# Patient Record
Sex: Male | Born: 1970 | Race: White | Hispanic: No | Marital: Married | Smoking: Former smoker
Health system: Southern US, Community
[De-identification: ages and names within clinical notes are randomized; demographics above are authoritative.]

## PROBLEM LIST (undated history)

## (undated) ENCOUNTER — Ambulatory Visit (HOSPITAL_COMMUNITY)

## (undated) DIAGNOSIS — C801 Malignant (primary) neoplasm, unspecified: Secondary | ICD-10-CM

## (undated) DIAGNOSIS — Z87442 Personal history of urinary calculi: Secondary | ICD-10-CM

## (undated) DIAGNOSIS — N200 Calculus of kidney: Secondary | ICD-10-CM

## (undated) DIAGNOSIS — G4730 Sleep apnea, unspecified: Secondary | ICD-10-CM

---

## 2001-11-13 DIAGNOSIS — C801 Malignant (primary) neoplasm, unspecified: Secondary | ICD-10-CM

## 2001-11-13 HISTORY — PX: SURGERY SCROTAL / TESTICULAR: SUR1316

## 2001-11-13 HISTORY — DX: Malignant (primary) neoplasm, unspecified: C80.1

## 2002-01-31 ENCOUNTER — Encounter: Payer: Self-pay | Admitting: Neurology

## 2002-01-31 ENCOUNTER — Ambulatory Visit (HOSPITAL_COMMUNITY): Admission: RE | Admit: 2002-01-31 | Discharge: 2002-01-31 | Payer: Self-pay | Admitting: Neurology

## 2002-02-01 ENCOUNTER — Emergency Department (HOSPITAL_COMMUNITY): Admit: 2002-02-01 | Discharge: 2002-02-01 | Payer: Self-pay

## 2003-01-15 ENCOUNTER — Ambulatory Visit (HOSPITAL_COMMUNITY): Admission: RE | Admit: 2003-01-15 | Discharge: 2003-01-15 | Payer: Self-pay | Admitting: Family Medicine

## 2003-01-15 ENCOUNTER — Encounter: Payer: Self-pay | Admitting: Family Medicine

## 2003-01-19 ENCOUNTER — Encounter (INDEPENDENT_AMBULATORY_CARE_PROVIDER_SITE_OTHER): Payer: Self-pay

## 2003-01-19 ENCOUNTER — Ambulatory Visit (HOSPITAL_BASED_OUTPATIENT_CLINIC_OR_DEPARTMENT_OTHER): Admission: RE | Admit: 2003-01-19 | Discharge: 2003-01-19 | Payer: Self-pay | Admitting: Urology

## 2003-01-20 ENCOUNTER — Encounter: Admission: RE | Admit: 2003-01-20 | Discharge: 2003-01-20 | Payer: Self-pay | Admitting: Urology

## 2003-01-20 ENCOUNTER — Encounter: Payer: Self-pay | Admitting: Urology

## 2003-01-22 ENCOUNTER — Ambulatory Visit: Admission: RE | Admit: 2003-01-22 | Discharge: 2003-03-09 | Payer: Self-pay | Admitting: Radiation Oncology

## 2003-06-10 ENCOUNTER — Encounter: Admission: RE | Admit: 2003-06-10 | Discharge: 2003-06-10 | Payer: Self-pay | Admitting: Urology

## 2003-06-10 ENCOUNTER — Encounter: Payer: Self-pay | Admitting: Urology

## 2003-09-04 ENCOUNTER — Encounter: Payer: Self-pay | Admitting: Urology

## 2003-09-04 ENCOUNTER — Encounter: Admission: RE | Admit: 2003-09-04 | Discharge: 2003-09-04 | Payer: Self-pay | Admitting: Urology

## 2003-10-01 ENCOUNTER — Ambulatory Visit: Admission: RE | Admit: 2003-10-01 | Discharge: 2003-10-01 | Payer: Self-pay | Admitting: Radiation Oncology

## 2003-11-25 ENCOUNTER — Ambulatory Visit (HOSPITAL_COMMUNITY): Admission: RE | Admit: 2003-11-25 | Discharge: 2003-11-25 | Payer: Self-pay | Admitting: Urology

## 2004-03-11 ENCOUNTER — Ambulatory Visit (HOSPITAL_COMMUNITY): Admission: RE | Admit: 2004-03-11 | Discharge: 2004-03-11 | Payer: Self-pay | Admitting: Urology

## 2004-03-31 ENCOUNTER — Ambulatory Visit: Admission: RE | Admit: 2004-03-31 | Discharge: 2004-03-31 | Payer: Self-pay | Admitting: Radiation Oncology

## 2004-04-22 ENCOUNTER — Emergency Department (HOSPITAL_COMMUNITY): Admission: EM | Admit: 2004-04-22 | Discharge: 2004-04-23 | Payer: Self-pay | Admitting: Emergency Medicine

## 2004-04-28 ENCOUNTER — Ambulatory Visit (HOSPITAL_BASED_OUTPATIENT_CLINIC_OR_DEPARTMENT_OTHER): Admission: RE | Admit: 2004-04-28 | Discharge: 2004-04-28 | Payer: Self-pay | Admitting: Urology

## 2004-11-04 ENCOUNTER — Ambulatory Visit (HOSPITAL_COMMUNITY): Admission: RE | Admit: 2004-11-04 | Discharge: 2004-11-04 | Payer: Self-pay | Admitting: Urology

## 2005-03-10 ENCOUNTER — Ambulatory Visit (HOSPITAL_COMMUNITY): Admission: RE | Admit: 2005-03-10 | Discharge: 2005-03-10 | Payer: Self-pay | Admitting: Urology

## 2005-08-12 ENCOUNTER — Emergency Department (HOSPITAL_COMMUNITY): Admission: EM | Admit: 2005-08-12 | Discharge: 2005-08-12 | Payer: Self-pay | Admitting: Family Medicine

## 2005-09-08 ENCOUNTER — Ambulatory Visit (HOSPITAL_COMMUNITY): Admission: RE | Admit: 2005-09-08 | Discharge: 2005-09-08 | Payer: Self-pay | Admitting: Urology

## 2006-03-16 ENCOUNTER — Ambulatory Visit (HOSPITAL_COMMUNITY): Admission: RE | Admit: 2006-03-16 | Discharge: 2006-03-16 | Payer: Self-pay | Admitting: Urology

## 2006-06-15 ENCOUNTER — Emergency Department (HOSPITAL_COMMUNITY): Admission: EM | Admit: 2006-06-15 | Discharge: 2006-06-15 | Payer: Self-pay | Admitting: Emergency Medicine

## 2006-08-06 ENCOUNTER — Ambulatory Visit: Payer: Self-pay | Admitting: Orthopedic Surgery

## 2007-03-15 ENCOUNTER — Ambulatory Visit (HOSPITAL_COMMUNITY): Admission: RE | Admit: 2007-03-15 | Discharge: 2007-03-15 | Payer: Self-pay | Admitting: Urology

## 2007-09-21 ENCOUNTER — Emergency Department (HOSPITAL_COMMUNITY): Admission: EM | Admit: 2007-09-21 | Discharge: 2007-09-21 | Payer: Self-pay | Admitting: Emergency Medicine

## 2007-09-21 ENCOUNTER — Encounter: Payer: Self-pay | Admitting: Orthopedic Surgery

## 2007-09-23 ENCOUNTER — Encounter (INDEPENDENT_AMBULATORY_CARE_PROVIDER_SITE_OTHER): Payer: Self-pay | Admitting: *Deleted

## 2007-09-23 ENCOUNTER — Ambulatory Visit: Payer: Self-pay | Admitting: Orthopedic Surgery

## 2007-09-23 DIAGNOSIS — M533 Sacrococcygeal disorders, not elsewhere classified: Secondary | ICD-10-CM | POA: Insufficient documentation

## 2007-09-23 DIAGNOSIS — S93409A Sprain of unspecified ligament of unspecified ankle, initial encounter: Secondary | ICD-10-CM | POA: Insufficient documentation

## 2007-09-25 ENCOUNTER — Encounter: Payer: Self-pay | Admitting: Orthopedic Surgery

## 2007-09-26 ENCOUNTER — Encounter: Payer: Self-pay | Admitting: Orthopedic Surgery

## 2007-10-07 ENCOUNTER — Ambulatory Visit: Payer: Self-pay | Admitting: Orthopedic Surgery

## 2007-11-22 ENCOUNTER — Encounter: Payer: Self-pay | Admitting: Orthopedic Surgery

## 2008-02-20 ENCOUNTER — Ambulatory Visit (HOSPITAL_COMMUNITY): Admission: RE | Admit: 2008-02-20 | Discharge: 2008-02-20 | Payer: Self-pay | Admitting: Urology

## 2009-07-11 ENCOUNTER — Emergency Department (HOSPITAL_COMMUNITY): Admission: EM | Admit: 2009-07-11 | Discharge: 2009-07-11 | Payer: Self-pay | Admitting: Emergency Medicine

## 2009-07-27 ENCOUNTER — Emergency Department (HOSPITAL_COMMUNITY): Admission: EM | Admit: 2009-07-27 | Discharge: 2009-07-27 | Payer: Self-pay | Admitting: Emergency Medicine

## 2009-12-22 ENCOUNTER — Ambulatory Visit (HOSPITAL_COMMUNITY): Admission: RE | Admit: 2009-12-22 | Discharge: 2009-12-22 | Payer: Self-pay | Admitting: Urology

## 2010-03-18 ENCOUNTER — Emergency Department (HOSPITAL_COMMUNITY): Admission: EM | Admit: 2010-03-18 | Discharge: 2010-03-18 | Payer: Self-pay | Admitting: Emergency Medicine

## 2010-06-05 ENCOUNTER — Emergency Department (HOSPITAL_COMMUNITY): Admission: EM | Admit: 2010-06-05 | Discharge: 2010-06-05 | Payer: Self-pay | Admitting: Emergency Medicine

## 2010-08-15 ENCOUNTER — Ambulatory Visit: Payer: Self-pay | Admitting: Cardiology

## 2010-08-15 ENCOUNTER — Observation Stay (HOSPITAL_COMMUNITY): Admission: EM | Admit: 2010-08-15 | Discharge: 2010-08-16 | Payer: Self-pay | Admitting: Emergency Medicine

## 2010-08-16 ENCOUNTER — Encounter (INDEPENDENT_AMBULATORY_CARE_PROVIDER_SITE_OTHER): Payer: Self-pay | Admitting: Emergency Medicine

## 2011-01-25 LAB — POCT CARDIAC MARKERS
CKMB, poc: <1 ng/mL — ABNORMAL LOW (ref 1.0–8.0)
CKMB, poc: <1 ng/mL — ABNORMAL LOW (ref 1.0–8.0)
Myoglobin, poc: 52 ng/mL (ref 12–200)
Myoglobin, poc: 52.3 ng/mL (ref 12–200)
Troponin i, poc: <0.05 ng/mL (ref 0.00–0.09)

## 2011-01-25 LAB — BASIC METABOLIC PANEL
Calcium: 9.2 mg/dL (ref 8.4–10.5)
Creatinine, Ser: 0.97 mg/dL (ref 0.4–1.5)
GFR calc Af Amer: >60 mL/min (ref >60)
GFR calc non Af Amer: >60 mL/min (ref >60)
Glucose, Bld: 103 mg/dL — ABNORMAL HIGH (ref 70–99)
Sodium: 140 mEq/L (ref 135–145)

## 2011-01-25 LAB — DIFFERENTIAL
Basophils Absolute: 0 10*3/uL (ref 0.0–0.1)
Basophils Relative: 1 % (ref 0–1)
Eosinophils Absolute: 0.1 10*3/uL (ref 0.0–0.7)
Monocytes Relative: 9 % (ref 3–12)
Neutro Abs: 3.6 10*3/uL (ref 1.7–7.7)
Neutrophils Relative %: 57 % (ref 43–77)

## 2011-01-25 LAB — CBC
Hemoglobin: 16.2 g/dL (ref 13.0–17.0)
MCH: 29.9 pg (ref 26.0–34.0)
MCHC: 34.2 g/dL (ref 30.0–36.0)
Platelets: 220 10*3/uL (ref 150–400)

## 2011-02-17 LAB — POCT CARDIAC MARKERS: Myoglobin, poc: 73.6 ng/mL (ref 12–200)

## 2011-02-17 LAB — POCT I-STAT, CHEM 8
Calcium, Ion: 1.18 mmol/L (ref 1.12–1.32)
Hemoglobin: 16.7 g/dL (ref 13.0–17.0)
Sodium: 141 mEq/L (ref 135–145)
TCO2: 26 mmol/L (ref 0–100)

## 2011-02-17 LAB — D-DIMER, QUANTITATIVE: D-Dimer, Quant: <0.22 ug/mL-FEU (ref 0.00–0.48)

## 2011-03-11 ENCOUNTER — Emergency Department (HOSPITAL_COMMUNITY)
Admission: EM | Admit: 2011-03-11 | Discharge: 2011-03-11 | Disposition: A | Discharge status: Home / Self Care | Payer: Self-pay | Attending: Emergency Medicine | Admitting: Emergency Medicine

## 2011-03-11 DIAGNOSIS — Y929 Unspecified place or not applicable: Secondary | ICD-10-CM | POA: Insufficient documentation

## 2011-03-11 DIAGNOSIS — W57XXXA Bitten or stung by nonvenomous insect and other nonvenomous arthropods, initial encounter: Secondary | ICD-10-CM | POA: Insufficient documentation

## 2011-03-11 DIAGNOSIS — S30860A Insect bite (nonvenomous) of lower back and pelvis, initial encounter: Secondary | ICD-10-CM | POA: Insufficient documentation

## 2011-03-31 NOTE — Consult Note (Signed)
[place]  Patient:    Sean [name], Sean [name] Visit Number: [id number] MRN: [id number]          Service Type: EMS Location: [facility] Attending Physician:  Pearletha Alfred Dictated by:   Melvyn Novas, M.D. Admit Date:  02/01/2002 Discharge Date: 02/01/2002                            Consultation Report  DATE OF BIRTH:  [birth year]  CHIEF COMPLAINT:  "Worst headache of my life."  HISTORY OF PRESENT ILLNESS:  This is a 40 year old Caucasian right-handed gentleman who works as a Naval architect, is married, and has a history of mild tension headaches.  He states that about a month ago, he started to have postcoital severe headaches, not associated with nausea or visual changes but rather a sudden attack of a headache in his posterior skull that would last for up to 10 minutes at a time.  He states that it helped him to lay down or to take Advil but those treatments did not erase the pain.  Earlier today and yesterday, he had additional attacks which worried his wife and him enough to seek care in the emergency room.  He had just last week seen Dr. Anne Hahn at [facility] for the first time who had asked him to get an MRI/MRA which was performed on Friday.  PAST MEDICAL HISTORY:  The patient denies diabetes, hypertension, or hypercholesterolemia.  He has occasional tension headaches.  He has chronic sinusitis.  MEDICATIONS:  At the current time, none.  ALLERGIES:  No allergies known.  SOCIAL HISTORY:  The patient works as a Naval architect and also he has rather irregular hours.  He spends every night at home.  He states that he normally wakes up at 6 in the morning and returns from work around 8 or 9 p.m.  He states that he is a nonsmoker but drinks a lot of caffeinated beverages.  He drinks rarely alcohol.  FAMILY HISTORY:  There is a strong family history of aneurysm bleeds, cerebrovascular hemorrhages, hypertension.  REVIEW OF  SYSTEMS:  The patient states that he has had no weight changes, fevers, recent infections, no chest pain, allergies, hypertension, coronary artery disease, palpitations, constipation, UTIs, bruises, fractures, sprains, anemia, or rash.  NEUROLOGIC REVIEW OF SYSTEMS:  The patient states that besides having severe headache in the form of almost clusters, he has otherwise only occasional tension headaches that are rather mild and originates in his neck/spine.  He denies dizziness, vertigo, dysarthria, syncope, tinnitus, or dysphasia.  He had no weakness, no numbness, no tingling, no gait or balance disorder, no loss of bowel or bladder continence, no loss of consciousness.  PHYSICAL EVALUATION:  VITAL SIGNS:  The patient has a height of 64 inches with a weight of approximately 210 pounds.  Blood pressure 135/85.  He has a regular heart rate at 68.  Visual acuity was not measured.  GENERAL APPEARANCE:  The patient has a supple neck, full range of motion although there is crepitation.  There were no cranial or cervical bruits.  No signs of infection, fever, or localized tenderness or injury.  CHEST:  There are clear lung sounds bilaterally to auscultation with normal lung expansion.  No distress.  No rales or rhonchi.  HEART:  Pulses were everywhere present without murmurs at baseline.  No vein distention.  No carotid artery bruits, no chest tightness.  EXTREMITIES:  Well-formed, no deformities.  Normal flexibility.  Normal muscle tone.  No contractures or wasting.  SKIN:  Normal color.  No rash, no edema.  The patient is slightly flushed in his face.  ABDOMEN:  Soft, nontender, nondistended.  The patient is obese.  NEUROLOGIC EXAMINATION:  Mental status:  Alert and oriented x3, somewhat worried and squirmy during the exam.  Cranial nerves:  Full visual fields to double simultaneous stimuli.  Normal fundi without palpable edema, pallor, or venous nicking.  Extraocular movements full  and conjugate.  Optokinetic reflexes are equally bilaterally.  Facial sensory and corneal reflexes are present and equal, symmetric facial strength and sensitivity.  Tongue protrudes midline.  Prompt evaluation of uvula in midline.  Shoulder shrug bilaterally equal.  MOTOR TESTING:  Normal strength, tone, and mass in all four extremities.  DTRs bilaterally equal at 1+.  No upgoing toe with plantar stimulation.  No clonus. Good fine motor movements with rapid alternating movements.  No pronator drift.  Cerebellar testing of fingernails was intact bilaterally.  Sensory testing is intact to responses of primary cortical modalities such as cold, vibration, pinprick, and touch.  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  The cerebellar tonsils protrude approximately 4 mm through the foramen magnum.  I, therefore, believe that Sean [name] has a baseline tension headache that has a neuromuscular component and responds to trigger point massage as well as an Arnold-Chiari-related headache that might worsen with long periods of sitting or standing position, rise in blood pressure, or increased intracerebral pressure.  PLAN: 1. I gave Sean [name], for severe headache attack, 6 tablets of Vicodin that he    can take home from the ER.  I also gave him one of the tablets here which    seemed to eradicate the headache component completely. 2. I believe that there is a stress-related origin of his tension headaches    that he therefore needs to address lifestyle issues and work at    [employer] issues with his family.  His wife was present during the    examination and agreed. 3. The Arnold-Chiari syndrome as a grade 1 can cause occasional headaches and    normally occurs during adolescence or early adulthood in a patients 20s or    30s.  Since there is no focal deficit and no  brain stem symptomatology in    the neurologic exam, this patient is certainly not impaired enough to     warrant any surgical evaluation.  I advised him to try to resume recumbent    position when acute headache attacks start and that he could resume his    activities within 20-30 minutes.  The patient will follow up with Dr. Lesia Sago at [facility] to discuss the MRI/MRA findings    and further treatment modalities for his headache. Dictated by:   Melvyn Novas, M.D. Attending Physician:  Pearletha Alfred DD:  02/01/02 TD:  02/03/02 Job: [id number]

## 2011-03-31 NOTE — Op Note (Signed)
Sean [name], Sean [name]                        ACCOUNT NO.:  [id number]   MEDICAL RECORD NO.:  [id number]                   PATIENT TYPE:  AMB   LOCATION:  [facility]                                 FACILITY:  [facility]   PHYSICIAN:  Bertram Millard. Dahlstedt, M.D.          DATE OF BIRTH:  12/25/1970   DATE OF PROCEDURE:  01/19/2003  DATE OF DISCHARGE:                                 OPERATIVE REPORT   PREOPERATIVE DIAGNOSIS:  Left testicular mass.   POSTOPERATIVE DIAGNOSIS:  Left testicular mass.   PROCEDURE:  Left radical orchiectomy.   SURGEON:  Bertram Millard. Retta Diones, M.D.   ASSISTANT:  Crecencio Mc, M.D.   ANESTHESIA:  General.   COMPLICATIONS:  None.   INDICATIONS:  The patient is a 40 year old white male who was recently  evaluated for a left testicular mass.  A testicular ultrasound was obtained,  which demonstrated multiple hypoechoic lesions within the left testis  consistent with possible malignancy.  The patient had serum tumor markers  drawn.  This revealed a normal LDH and AST level.  However, the patient's  beta HCG level was elevated at 119.  After discussing the above findings  with the patient, he decided to proceed with a left radical orchiectomy.  The potential risks and benefits of this procedure were explained to the  patient, and he consented.   DESCRIPTION OF PROCEDURE:  The patient was taken to the operating room and a  general anesthetic was administered.  The patient was given preoperative  antibiotics, placed in a supine position, and his left groin was prepped and  draped in the usual sterile fashion.  Next a skin incision was made with the  #15 blade over the left external inguinal ring.  The incision measured  approximately 4-5 cm.  The subcutaneous tissues were then taken down with  electrocautery.  The shelving edge of the external oblique was delineated  with utilization of Metzenbaum scissors.  The external ring was then  identified and the cord was able  to be delivered with aid of a DeBakey  forceps.  A Penrose drain was then doubly passed around the inguinal canal.  The testis was then delivered and the gubernacular attachments were taken  down with electrocautery.  The external oblique was then opened at the level  of the superficial ring.  The cremasteric attachments to the cord were  dissected free, allowing the spermatic cord to be mobilized proximally.  Three Kelly clamps were then placed around the spermatic cord and the  Penrose drain was removed.  The specimen was divided between the middle and  most distal clamps and passed off the table for permanent pathologic  specimen.  A 2-0 silk tie was then placed around the proximal clamp, and the  cord was ligated.  A 0 silk suture ligature was then placed around the  distal clamp and the cord was ligated here as well.  The cord was then  allowed to retract proximally toward the retroperitoneum.  The wound was  then inspected and hemostasis appeared adequate.  Of note, a spermatic cord  block had been administered with approximately 7 mL of 0.5% Marcaine prior  to dividing the cord.  The external oblique was then brought together with a  running 2-0 silk suture.  Scarpa's fascia was reapproximated with a running  2-0 Vicryl suture.  Approximately three more cubic centimeters of 0.5%  Marcaine were used for local anesthesia of the superficial wound.  The skin  was then reapproximated with a running 4-0 Vicryl subcuticular suture.  Steri-Strips and a sterile dressing were placed.  The  patient appeared to tolerate the procedure well and without any  complications.  He was able to be transferred to the recovery unit in  satisfactory condition.  Please note that Bertram Millard. Dahlstedt, M.D., was  the operating surgeon and was present and participated in the entire  procedure.     Crecencio Mc, M.D.                          Bertram Millard. Retta Diones, M.D.    Arlana Hove  D:  01/19/2003  T:   01/19/2003  Job:  [id number]

## 2011-03-31 NOTE — Op Note (Signed)
Sean [name], Sean [name]                        ACCOUNT NO.:  [id number]   MEDICAL RECORD NO.:  [id number]                   PATIENT TYPE:  AMB   LOCATION:  [facility]                                 FACILITY:  [facility]   PHYSICIAN:  Bertram Millard. Dahlstedt, M.D.          DATE OF BIRTH:  September 23, 1971   DATE OF PROCEDURE:  04/28/2004  DATE OF DISCHARGE:                                 OPERATIVE REPORT   PREOPERATIVE DIAGNOSIS:  Right ureteral stone.   POSTOPERATIVE DIAGNOSIS:  Right ureteral stone, passed.   PRINCIPAL PROCEDURES:  1. Cystoscopy.  2. Bilateral retrograde ureteral pyelograms.  3. Right ureteroscopy.   SURGEON:  Bertram Millard. Dahlstedt, M.D.   ANESTHESIA:  General with LMA.   COMPLICATIONS:  None.   BRIEF HISTORY:  This 40 year old male has been seeing me for approximately 5  weeks with a symptomatic right ureteral stone.  His last symptom was  yesterday morning.  He first presented with an upper ureteral stone.  It was  measured 3-4 mm in size.  He had significant pain over the weekend and had a  CT scan.  This showed a lower ureteral stone on the right.  He has also had  some dysuria recently.   I saw the patient in the office yesterday.  KUB failed to show any  significant calcifications, but he has a very thick abdomen.  Prior KUB  failed to show a significant stone as well.  At that time, CT urogram did  show the upper ureteral stone.   The patient has requested treatment for the stone.  As the stone was not  visible on a KUB, I suggested that we proceed with cytoscopy and  ureteroscopy and possible holmium laser.  He presents at this time for that  procedure.   DESCRIPTION OF PROCEDURE:  The patient was administered preoperative IV  antibiotics.  He was taken to the operating room where a general anesthetic  was administered using the LMA.  He was placed in the dorsal lithotomy  position.  Genitalia and perineum were prepped and draped.  A 22 French  panendoscope was  advanced into his bladder.  The bladder appeared  unremarkable.  The right ureter was cannulated with a 6 [place] open end  catheter.  Retrograde failed to show any filling defect in the distal, mid  or proximal ureter.  There was no evident hydronephrosis.  A left retrograde  was performed at that time as well.  Due to the fact that this patient had  stone symptoms as recently as yesterday, I then decided to perform  ureteroscopy.  A 6 French ureteroscope was advanced through the urethra and  into the bladder.  Using a guide wire as a guide, the entire right ureter  was evaluated with the ureteroscope.  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  This was traversed.  The mid and proximal ureter was  normal.  At this  point, the scope was removed.  The bladder was drained and  the procedure terminated.  It was felt that the patient passed the stone as  soon as yesterday.   A B&O suppository was placed and the patient was given 30 mg of Toradol IV.   He will be followed up in approximately one week.   DISCHARGE MEDICATIONS INCLUDE:  1. Bactrim DS one p.o. b.i.d. for 5 days.  2. Urelle one p.o. q.6 hours p.r.n. urinary burning.  3. Percocet which he has at home.                                               Bertram Millard. Dahlstedt, M.D.    SMD/MEDQ  D:  04/28/2004  T:  04/28/2004  Job:  [id number]   cc:   Kirk Ruths, M.D.  [address]  [place]  [place] [address]  Fax: [phone]

## 2011-09-14 ENCOUNTER — Inpatient Hospital Stay (INDEPENDENT_AMBULATORY_CARE_PROVIDER_SITE_OTHER)
Admission: RE | Admit: 2011-09-14 | Discharge: 2011-09-14 | Disposition: A | Discharge status: Home / Self Care | Payer: BC Managed Care – PPO | Source: Ambulatory Visit | Attending: Family Medicine | Admitting: Family Medicine

## 2011-09-14 DIAGNOSIS — J029 Acute pharyngitis, unspecified: Secondary | ICD-10-CM

## 2012-11-12 ENCOUNTER — Encounter (HOSPITAL_COMMUNITY): Payer: Self-pay | Admitting: *Deleted

## 2012-11-12 ENCOUNTER — Emergency Department (HOSPITAL_COMMUNITY)
Admission: EM | Admit: 2012-11-12 | Discharge: 2012-11-12 | Disposition: A | Discharge status: Home / Self Care | Payer: BC Managed Care – PPO | Attending: Emergency Medicine | Admitting: Emergency Medicine

## 2012-11-12 DIAGNOSIS — R05 Cough: Secondary | ICD-10-CM | POA: Insufficient documentation

## 2012-11-12 DIAGNOSIS — IMO0001 Reserved for inherently not codable concepts without codable children: Secondary | ICD-10-CM | POA: Insufficient documentation

## 2012-11-12 DIAGNOSIS — K5289 Other specified noninfective gastroenteritis and colitis: Secondary | ICD-10-CM | POA: Insufficient documentation

## 2012-11-12 DIAGNOSIS — Z87891 Personal history of nicotine dependence: Secondary | ICD-10-CM | POA: Insufficient documentation

## 2012-11-12 DIAGNOSIS — R6883 Chills (without fever): Secondary | ICD-10-CM | POA: Insufficient documentation

## 2012-11-12 DIAGNOSIS — R51 Headache: Secondary | ICD-10-CM | POA: Insufficient documentation

## 2012-11-12 DIAGNOSIS — R059 Cough, unspecified: Secondary | ICD-10-CM | POA: Insufficient documentation

## 2012-11-12 DIAGNOSIS — R52 Pain, unspecified: Secondary | ICD-10-CM | POA: Insufficient documentation

## 2012-11-12 DIAGNOSIS — R630 Anorexia: Secondary | ICD-10-CM | POA: Insufficient documentation

## 2012-11-12 DIAGNOSIS — R197 Diarrhea, unspecified: Secondary | ICD-10-CM | POA: Insufficient documentation

## 2012-11-12 DIAGNOSIS — J3489 Other specified disorders of nose and nasal sinuses: Secondary | ICD-10-CM | POA: Insufficient documentation

## 2012-11-12 DIAGNOSIS — Z8547 Personal history of malignant neoplasm of testis: Secondary | ICD-10-CM | POA: Insufficient documentation

## 2012-11-12 DIAGNOSIS — R5381 Other malaise: Secondary | ICD-10-CM | POA: Insufficient documentation

## 2012-11-12 DIAGNOSIS — K529 Noninfective gastroenteritis and colitis, unspecified: Secondary | ICD-10-CM

## 2012-11-12 DIAGNOSIS — R5383 Other fatigue: Secondary | ICD-10-CM | POA: Insufficient documentation

## 2012-11-12 HISTORY — DX: Malignant (primary) neoplasm, unspecified: C80.1

## 2012-11-12 MED ORDER — HYDROCODONE-ACETAMINOPHEN 5-325 MG PO TABS
2.0000 | ORAL_TABLET | Freq: Once | ORAL | Status: AC
  Administered 2012-11-12: 2 via ORAL
  Filled 2012-11-12: qty 2

## 2012-11-12 MED ORDER — PROMETHAZINE HCL 12.5 MG PO TABS
25.0000 mg | ORAL_TABLET | Freq: Once | ORAL | Status: AC
  Administered 2012-11-12: 25 mg via ORAL
  Filled 2012-11-12: qty 2

## 2012-11-12 MED ORDER — HYDROCODONE-ACETAMINOPHEN 7.5-325 MG PO TABS: 1.0000 | ORAL_TABLET | ORAL | Status: AC | PRN

## 2012-11-12 MED ORDER — PROMETHAZINE HCL 12.5 MG PO TABS: 12.5000 mg | ORAL_TABLET | Freq: Four times a day (QID) | ORAL | Status: DC | PRN

## 2012-11-12 MED ORDER — PSEUDOEPHEDRINE HCL 60 MG PO TABS
60.0000 mg | ORAL_TABLET | Freq: Once | ORAL | Status: AC
  Administered 2012-11-12: 60 mg via ORAL
  Filled 2012-11-12: qty 1

## 2012-11-12 MED ORDER — PSEUDOEPHEDRINE HCL 60 MG PO TABS: ORAL_TABLET | ORAL | Status: DC

## 2012-11-12 NOTE — ED Notes (Signed)
Pt states N/V/D x2 days, body aches, chills.

## 2012-11-12 NOTE — ED Provider Notes (Signed)
History     CSN: [id number]  Arrival date & time 11/12/12  4098   First MD Initiated Contact with Patient 11/12/12 1920      Chief Complaint  Patient presents with  . Nausea  . Emesis  . Diarrhea  . Generalized Body Aches    (Consider location/radiation/quality/duration/timing/severity/associated sxs/prior treatment) Patient is a 41 y.o. male presenting with vomiting and diarrhea. The history is provided by the patient and the spouse.  Emesis  This is a new problem. The current episode started 2 days ago. The problem occurs 2 to 4 times per day. The problem has not changed since onset.Maximum temperature: Temp not checked, but wife states pt felt warm. Associated symptoms include chills, cough, diarrhea, headaches and myalgias. Pertinent negatives include no abdominal pain and no arthralgias. Associated symptoms comments: Headache and nasal congestion.. Risk factors include ill contacts.  Diarrhea The primary symptoms include fatigue, nausea, vomiting, diarrhea and myalgias. Primary symptoms do not include abdominal pain, melena, hematemesis, jaundice, hematochezia, dysuria, arthralgias or rash. The illness began 2 days ago. The onset was gradual. The problem has not changed since onset. The illness is also significant for chills and anorexia. The illness does not include back pain. Associated medical issues comments: testicular cancer.    Past Medical History  Diagnosis Date  . Cancer 2003    testicular    History reviewed. No pertinent past surgical history.  History reviewed. No pertinent family history.  History  Substance Use Topics  . Smoking status: Former Games developer  . Smokeless tobacco: Not on file  . Alcohol Use: Yes      Review of Systems  Constitutional: Positive for chills and fatigue. Negative for activity change.       All ROS Neg except as noted in HPI  HENT: Negative for nosebleeds and neck pain.   Eyes: Negative for photophobia and discharge.    Respiratory: Positive for cough. Negative for shortness of breath and wheezing.   Cardiovascular: Negative for chest pain and palpitations.  Gastrointestinal: Positive for nausea, vomiting, diarrhea and anorexia. Negative for abdominal pain, blood in stool, melena, hematochezia, hematemesis and jaundice.  Genitourinary: Negative for dysuria, frequency and hematuria.  Musculoskeletal: Positive for myalgias. Negative for back pain and arthralgias.  Skin: Negative.  Negative for rash.  Neurological: Positive for headaches. Negative for dizziness, seizures and speech difficulty.  Psychiatric/Behavioral: Negative for hallucinations and confusion.    Allergies  Hydromorphone hcl  Home Medications  No current outpatient prescriptions on file.  BP 108/60  Pulse 100  Temp 97.7 F (36.5 C) (Oral)  Resp 20  Ht 5\' 9"  (1.753 m)  Wt 237 lb (107.502 kg)  BMI 35.00 kg/m2  SpO2 98%  Physical Exam  Nursing note and vitals reviewed. Constitutional: He is oriented to person, place, and time. He appears well-developed and well-nourished.  Non-toxic appearance.  HENT:  Head: Normocephalic.  Right Ear: Tympanic membrane and external ear normal.  Left Ear: Tympanic membrane and external ear normal.       Nasal congestion.  Eyes: EOM and lids are normal. Pupils are equal, round, and reactive to light.  Neck: Normal range of motion. Neck supple. Carotid bruit is not present.  Cardiovascular: Normal rate, regular rhythm, normal heart sounds, intact distal pulses and normal pulses.   Pulmonary/Chest: Breath sounds normal. No respiratory distress.  Abdominal: Soft. Bowel sounds are normal. He exhibits no distension and no mass. There is no tenderness. There is no rebound and no guarding.  Musculoskeletal: Normal  range of motion. He exhibits no edema and no tenderness.  Lymphadenopathy:       Head (right side): No submandibular adenopathy present.       Head (left side): No submandibular adenopathy  present.    He has no cervical adenopathy.  Neurological: He is alert and oriented to person, place, and time. He has normal strength. No cranial nerve deficit or sensory deficit.  Skin: Skin is warm and dry. He is not diaphoretic. No cyanosis. No pallor.  Psychiatric: He has a normal mood and affect. His speech is normal.    ED Course  Procedures (including critical care time)  Labs Reviewed - No data to display No results found. Pulse oximetry 98% on room air. Within normal limits by my interpretation.  No diagnosis found.    MDM  I have reviewed nursing notes, vital signs, and all appropriate lab and imaging results for this patient. History and examination are consistent with viral illness. The vital signs are noted to be well within normal limits. The patient is treated with Phenergan 12.5 mg every 6 hours, Sudafed 3 times daily for congestion, and Norco every 4 hours if needed for pain and body aches. Patient advised to increase fluids, and wash hands frequently. Patient is to see his primary physician if not improving.       Kathie Dike, [place] 11/13/12 1904

## 2012-11-15 NOTE — ED Provider Notes (Signed)
Medical screening examination/treatment/procedure(s) were performed by non-physician practitioner and as supervising physician I was immediately available for consultation/collaboration.  Donnetta Hutching, MD 11/15/12 [phone]

## 2013-08-20 ENCOUNTER — Other Ambulatory Visit: Payer: Self-pay | Admitting: Urology

## 2013-08-20 ENCOUNTER — Ambulatory Visit (HOSPITAL_COMMUNITY)
Admission: RE | Admit: 2013-08-20 | Discharge: 2013-08-20 | Disposition: A | Discharge status: Home / Self Care | Payer: BC Managed Care – PPO | Source: Ambulatory Visit | Attending: Urology | Admitting: Urology

## 2013-08-20 DIAGNOSIS — C6290 Malignant neoplasm of unspecified testis, unspecified whether descended or undescended: Secondary | ICD-10-CM

## 2014-12-27 ENCOUNTER — Emergency Department (INDEPENDENT_AMBULATORY_CARE_PROVIDER_SITE_OTHER)
Admission: EM | Admit: 2014-12-27 | Discharge: 2014-12-27 | Disposition: A | Discharge status: Home / Self Care | Payer: BLUE CROSS/BLUE SHIELD | Source: Home / Self Care | Attending: Family Medicine | Admitting: Family Medicine

## 2014-12-27 ENCOUNTER — Encounter (HOSPITAL_COMMUNITY): Payer: Self-pay

## 2014-12-27 DIAGNOSIS — J069 Acute upper respiratory infection, unspecified: Secondary | ICD-10-CM

## 2014-12-27 MED ORDER — HYDROCOD POLST-CHLORPHEN POLST 10-8 MG/5ML PO LQCR: 5.0000 mL | Freq: Two times a day (BID) | ORAL | Status: DC | PRN

## 2014-12-27 MED ORDER — IPRATROPIUM BROMIDE 0.06 % NA SOLN: 2.0000 | Freq: Four times a day (QID) | NASAL | Status: DC

## 2014-12-27 NOTE — ED Provider Notes (Signed)
CSN: [id number]     Arrival date & time 12/27/14  5170 History   First MD Initiated Contact with Patient 12/27/14 1003     Chief Complaint  Patient presents with  . URI   (Consider location/radiation/quality/duration/timing/severity/associated sxs/prior Treatment) Patient is a 44 y.o. male presenting with URI. The history is provided by the patient.  URI Presenting symptoms: congestion, cough and rhinorrhea   Presenting symptoms: no fever   Severity:  Mild Onset quality:  Gradual Duration:  2 days Progression:  Unchanged Chronicity:  New Relieved by:  None tried Worsened by:  Nothing tried Associated symptoms: no wheezing   Risk factors: sick contacts   Risk factors comment:  Wife had similar sx first.   Past Medical History  Diagnosis Date  . Cancer 2003    testicular   History reviewed. No pertinent past surgical history. History reviewed. No pertinent family history. History  Substance Use Topics  . Smoking status: Former Research scientist (life sciences)  . Smokeless tobacco: Not on file  . Alcohol Use: Yes    Review of Systems  Constitutional: Negative.  Negative for fever.  HENT: Positive for congestion, postnasal drip and rhinorrhea.   Respiratory: Positive for cough. Negative for shortness of breath and wheezing.   Cardiovascular: Negative.   Gastrointestinal: Negative.     Allergies  Hydromorphone hcl  Home Medications   Prior to Admission medications   Medication Sig Start Date End Date Taking? Authorizing Provider  acetaminophen (TYLENOL) 500 MG tablet Take 500-1,000 mg by mouth every 6 (six) hours as needed. For pain/fever    Historical Provider, MD  aspirin EC 81 MG tablet Take 81 mg by mouth daily.    Historical Provider, MD  promethazine (PHENERGAN) 12.5 MG tablet Take 1 tablet (12.5 mg total) by mouth every 6 (six) hours as needed for nausea. 11/12/12   Lenox Ahr, PA-C  pseudoephedrine (SUDAFED) 60 MG tablet 1 po tid for congestion 11/12/12   Lenox Ahr, PA-C    BP 126/89 mmHg  Pulse 80  Temp(Src) 98 F (36.7 C) (Oral)  SpO2 98% Physical Exam  Constitutional: He is oriented to person, place, and time. He appears well-developed and well-nourished. No distress.  HENT:  Head: Normocephalic.  Right Ear: External ear normal.  Left Ear: External ear normal.  Mouth/Throat: Oropharynx is clear and moist.  Eyes: Pupils are equal, round, and reactive to light.  Neck: Normal range of motion. Neck supple.  Cardiovascular: Regular rhythm, normal heart sounds and intact distal pulses.   Pulmonary/Chest: Effort normal and breath sounds normal.  Lymphadenopathy:    He has no cervical adenopathy.  Neurological: He is alert and oriented to person, place, and time.  Skin: Skin is warm and dry.  Nursing note and vitals reviewed.   ED Course  Procedures (including critical care time) Labs Review Labs Reviewed - No data to display  Imaging Review No results found.   MDM   1. URI (upper respiratory infection)    Pt states that tussionex seems to work real good for me.    Billy Fischer, MD 12/27/14 [phone]

## 2014-12-27 NOTE — Discharge Instructions (Signed)
Drink plenty of fluids as discussed, use medicine as prescribed, and mucinex or delsym for cough. Return or see your doctor if further problems °

## 2014-12-27 NOTE — ED Notes (Signed)
C/o URI type symptoms x 3 days. Has not taken anything for his problems

## 2015-03-24 ENCOUNTER — Encounter (HOSPITAL_COMMUNITY): Payer: Self-pay | Admitting: *Deleted

## 2015-03-24 ENCOUNTER — Observation Stay (HOSPITAL_COMMUNITY)
Admission: EM | Admit: 2015-03-24 | Discharge: 2015-03-26 | Disposition: A | Discharge status: Home / Self Care | Payer: BLUE CROSS/BLUE SHIELD | Attending: Urology | Admitting: Urology

## 2015-03-24 ENCOUNTER — Emergency Department (HOSPITAL_COMMUNITY): Payer: BLUE CROSS/BLUE SHIELD

## 2015-03-24 DIAGNOSIS — Z8547 Personal history of malignant neoplasm of testis: Secondary | ICD-10-CM | POA: Diagnosis not present

## 2015-03-24 DIAGNOSIS — Z6838 Body mass index (BMI) 38.0-38.9, adult: Secondary | ICD-10-CM | POA: Insufficient documentation

## 2015-03-24 DIAGNOSIS — N23 Unspecified renal colic: Secondary | ICD-10-CM

## 2015-03-24 DIAGNOSIS — Z791 Long term (current) use of non-steroidal anti-inflammatories (NSAID): Secondary | ICD-10-CM | POA: Insufficient documentation

## 2015-03-24 DIAGNOSIS — Z7982 Long term (current) use of aspirin: Secondary | ICD-10-CM | POA: Insufficient documentation

## 2015-03-24 DIAGNOSIS — Z79899 Other long term (current) drug therapy: Secondary | ICD-10-CM | POA: Diagnosis not present

## 2015-03-24 DIAGNOSIS — R109 Unspecified abdominal pain: Secondary | ICD-10-CM | POA: Diagnosis present

## 2015-03-24 DIAGNOSIS — N200 Calculus of kidney: Principal | ICD-10-CM | POA: Insufficient documentation

## 2015-03-24 DIAGNOSIS — Z87891 Personal history of nicotine dependence: Secondary | ICD-10-CM | POA: Diagnosis not present

## 2015-03-24 DIAGNOSIS — Z419 Encounter for procedure for purposes other than remedying health state, unspecified: Secondary | ICD-10-CM

## 2015-03-24 LAB — COMPREHENSIVE METABOLIC PANEL
ALBUMIN: 4.3 g/dL (ref 3.5–5.0)
ALT: 55 U/L (ref 17–63)
ANION GAP: 12 (ref 5–15)
AST: 30 U/L (ref 15–41)
Alkaline Phosphatase: 61 U/L (ref 38–126)
BILIRUBIN TOTAL: 0.8 mg/dL (ref 0.3–1.2)
BUN: 13 mg/dL (ref 6–20)
CHLORIDE: 104 mmol/L (ref 101–111)
CO2: 25 mmol/L (ref 22–32)
CREATININE: 1.15 mg/dL (ref 0.61–1.24)
Calcium: 9.6 mg/dL (ref 8.9–10.3)
GFR calc Af Amer: >60 mL/min (ref >60)
GFR calc non Af Amer: >60 mL/min (ref >60)
Glucose, Bld: 133 mg/dL — ABNORMAL HIGH (ref 70–99)
Potassium: 4.2 mmol/L (ref 3.5–5.1)
Sodium: 141 mmol/L (ref 135–145)
TOTAL PROTEIN: 7.4 g/dL (ref 6.5–8.1)

## 2015-03-24 LAB — URINALYSIS, ROUTINE W REFLEX MICROSCOPIC
BILIRUBIN URINE: NEGATIVE
GLUCOSE, UA: NEGATIVE mg/dL
Ketones, ur: NEGATIVE mg/dL
Nitrite: NEGATIVE
PH: 6.5 (ref 5.0–8.0)
Protein, ur: 100 mg/dL — AB
SPECIFIC GRAVITY, URINE: 1.024 (ref 1.005–1.030)
Urobilinogen, UA: 0.2 mg/dL (ref 0.0–1.0)

## 2015-03-24 LAB — URINE MICROSCOPIC-ADD ON

## 2015-03-24 LAB — CBC WITH DIFFERENTIAL/PLATELET
BASOS ABS: 0.1 10*3/uL (ref 0.0–0.1)
BASOS PCT: 1 % (ref 0–1)
EOS ABS: 0.2 10*3/uL (ref 0.0–0.7)
EOS PCT: 2 % (ref 0–5)
HCT: 47.2 % (ref 39.0–52.0)
Hemoglobin: 15.7 g/dL (ref 13.0–17.0)
Lymphocytes Relative: 35 % (ref 12–46)
Lymphs Abs: 2.8 10*3/uL (ref 0.7–4.0)
MCH: 29.3 pg (ref 26.0–34.0)
MCHC: 33.3 g/dL (ref 30.0–36.0)
MCV: 88.2 fL (ref 78.0–100.0)
Monocytes Absolute: 0.9 10*3/uL (ref 0.1–1.0)
Monocytes Relative: 11 % (ref 3–12)
NEUTROS ABS: 3.9 10*3/uL (ref 1.7–7.7)
NEUTROS PCT: 51 % (ref 43–77)
PLATELETS: 245 10*3/uL (ref 150–400)
RBC: 5.35 MIL/uL (ref 4.22–5.81)
RDW: 13.2 % (ref 11.5–15.5)
WBC: 7.8 10*3/uL (ref 4.0–10.5)

## 2015-03-24 MED ORDER — FENTANYL CITRATE (PF) 100 MCG/2ML IJ SOLN
INTRAMUSCULAR | Status: AC
  Filled 2015-03-24: qty 2

## 2015-03-24 MED ORDER — MORPHINE SULFATE 4 MG/ML IJ SOLN: 8.0000 mg | INTRAMUSCULAR | Status: DC | PRN

## 2015-03-24 MED ORDER — FENTANYL CITRATE (PF) 100 MCG/2ML IJ SOLN
75.0000 ug | Freq: Once | INTRAMUSCULAR | Status: AC
  Administered 2015-03-24: 75 ug via INTRAVENOUS

## 2015-03-24 MED ORDER — SODIUM CHLORIDE 0.9 % IV BOLUS (SEPSIS)
1000.0000 mL | Freq: Once | INTRAVENOUS | Status: AC
  Administered 2015-03-24: 1000 mL via INTRAVENOUS

## 2015-03-24 MED ORDER — MORPHINE SULFATE 4 MG/ML IJ SOLN
8.0000 mg | Freq: Once | INTRAMUSCULAR | Status: AC
  Administered 2015-03-24: 8 mg via INTRAVENOUS
  Filled 2015-03-24: qty 2

## 2015-03-24 MED ORDER — ONDANSETRON HCL 4 MG/2ML IJ SOLN
INTRAMUSCULAR | Status: AC
  Filled 2015-03-24: qty 2

## 2015-03-24 MED ORDER — MORPHINE SULFATE 4 MG/ML IJ SOLN: 4.0000 mg | Freq: Once | INTRAMUSCULAR | Status: DC

## 2015-03-24 MED ORDER — MORPHINE SULFATE 4 MG/ML IJ SOLN
6.0000 mg | Freq: Once | INTRAMUSCULAR | Status: AC
  Administered 2015-03-24: 6 mg via INTRAVENOUS
  Filled 2015-03-24: qty 2

## 2015-03-24 MED ORDER — ONDANSETRON HCL 4 MG/2ML IJ SOLN
4.0000 mg | Freq: Once | INTRAMUSCULAR | Status: AC
  Administered 2015-03-24: 4 mg via INTRAVENOUS

## 2015-03-24 MED ORDER — KETOROLAC TROMETHAMINE 30 MG/ML IJ SOLN
30.0000 mg | Freq: Once | INTRAMUSCULAR | Status: AC
  Administered 2015-03-24: 30 mg via INTRAVENOUS
  Filled 2015-03-24: qty 1

## 2015-03-24 MED ORDER — ONDANSETRON HCL 4 MG/2ML IJ SOLN
4.0000 mg | Freq: Once | INTRAMUSCULAR | Status: AC
  Administered 2015-03-24: 4 mg via INTRAVENOUS
  Filled 2015-03-24: qty 2

## 2015-03-24 NOTE — ED Notes (Signed)
Pt complains of severe right sided abdominal pain for the past hour. Pt states he was told he has a large kidney stone, was told by [facility] to come to ED.

## 2015-03-24 NOTE — ED Provider Notes (Signed)
  Physical Exam  BP 113/69 mmHg  Pulse 72  SpO2 100%  Physical Exam  ED Course  Procedures  MDM Assumed care of pt from Dr. Darl Householder.  Briefly, pt is presenting with renal colic, known stone.  Urology evaluating pt at time of assumption of care with tentative plan to admit, pain refractory.  Pt admitted by urology.  Renal colic on right side       Debby Freiberg, MD 03/25/15 0600

## 2015-03-24 NOTE — ED Provider Notes (Signed)
CSN: [id number]     Arrival date & time 03/24/15  2124 History   First MD Initiated Contact with Patient 03/24/15 2142     Chief Complaint  Patient presents with  . Abdominal Pain     (Consider location/radiation/quality/duration/timing/severity/associated sxs/prior Treatment) The history is provided by the patient and the spouse.  Sean [name] is a 44 y.o. male history of testicular cancer here presenting with right flank pain. Right flank pain for the last several days and had a CT in the office that showed right 6 mm stone. He was sent home with pain medicine and Flomax. States that the pain improved initially and then came back about 2 hours ago and associated with some hematuria. Denies any dysuria or fever or vomiting but feels nauseated.    Past Medical History  Diagnosis Date  . Cancer 2003    testicular   History reviewed. No pertinent past surgical history. No family history on file. History  Substance Use Topics  . Smoking status: Former Research scientist (life sciences)  . Smokeless tobacco: Not on file  . Alcohol Use: Yes    Review of Systems  Gastrointestinal: Positive for abdominal pain.  Genitourinary: Positive for flank pain.  All other systems reviewed and are negative.     Allergies  Hydromorphone hcl  Home Medications   Prior to Admission medications   Medication Sig Start Date End Date Taking? Authorizing Provider  aspirin EC 81 MG tablet Take 81 mg by mouth daily.   Yes Historical Provider, MD  ibuprofen (ADVIL,MOTRIN) 200 MG tablet Take 800 mg by mouth every 6 (six) hours as needed for moderate pain.   Yes Historical Provider, MD  oxyCODONE-acetaminophen (PERCOCET/ROXICET) 5-325 MG per tablet Take 1 tablet by mouth every 4 (four) hours as needed. pain 03/17/15  Yes Historical Provider, MD  tamsulosin (FLOMAX) 0.4 MG CAPS capsule Take 1 capsule by mouth daily. 03/17/15  Yes Historical Provider, MD  chlorpheniramine-HYDROcodone (TUSSIONEX PENNKINETIC ER) 10-8 MG/5ML LQCR Take  5 mLs by mouth every 12 (twelve) hours as needed for cough. Patient not taking: Reported on 03/24/2015 12/27/14   Billy Fischer, MD  ipratropium (ATROVENT) 0.06 % nasal spray Place 2 sprays into both nostrils 4 (four) times daily. Patient not taking: Reported on 03/24/2015 12/27/14   Billy Fischer, MD  promethazine (PHENERGAN) 12.5 MG tablet Take 1 tablet (12.5 mg total) by mouth every 6 (six) hours as needed for nausea. Patient not taking: Reported on 03/24/2015 11/12/12   Lily Kocher, PA-C  pseudoephedrine (SUDAFED) 60 MG tablet 1 po tid for congestion Patient not taking: Reported on 03/24/2015 11/12/12   Lily Kocher, PA-C   BP 113/69 mmHg  Pulse 72  SpO2 100% Physical Exam  Constitutional: He is oriented to person, place, and time.  Uncomfortable   HENT:  Head: Normocephalic.  Mouth/Throat: Oropharynx is clear and moist.  Eyes: Conjunctivae are normal. Pupils are equal, round, and reactive to light.  Neck: Normal range of motion. Neck supple.  Cardiovascular: Normal rate, regular rhythm and normal heart sounds.   Pulmonary/Chest: Effort normal and breath sounds normal. No respiratory distress. He has no wheezes. He has no rales.  Abdominal: Soft. Bowel sounds are normal. He exhibits no distension. There is no tenderness. There is no rebound.  + R CVAT   Musculoskeletal: Normal range of motion. He exhibits no edema or tenderness.  Neurological: He is alert and oriented to person, place, and time. No cranial nerve deficit. Coordination normal.  Skin: Skin is warm  and dry.  Psychiatric: He has a normal mood and affect. His behavior is normal. Judgment and thought content normal.  Nursing note and vitals reviewed.   ED Course  Procedures (including critical care time)  Renal ultrasound Multiple views of bilateral kidneys performed with curvolinear probe for L flank pain. Mild hydro on R side, no hydro on L. No obvious bladder stone.   Labs Review Labs Reviewed  COMPREHENSIVE  METABOLIC PANEL - Abnormal; Notable for the following:    Glucose, Bld 133 (*)    All other components within normal limits  CBC WITH DIFFERENTIAL/PLATELET  URINALYSIS, ROUTINE W REFLEX MICROSCOPIC    Imaging Review Dg Abd 1 View  03/24/2015   CLINICAL DATA:  Known right nephrolithiasis. Pain for 3 days, unbearable tonight  EXAM: ABDOMEN - 1 VIEW  COMPARISON:  None.  FINDINGS: There is a 5 x 7 mm calcification adjacent to the right L3 transverse process. This likely represents a urinary calculus at the right ureteropelvic junction or in the proximal right ureter. There is a 2 mm calcification superimposed on the left kidney, likely a midpole calculus. Visible portions of the abdominal gas pattern are unremarkable.  IMPRESSION: 1. Probable 5 x 7 mm right UPJ calculus 2. Probable 2 mm midpole left collecting system calculus   Electronically Signed   By: Andreas Newport M.D.   On: 03/24/2015 22:37     EKG Interpretation None      MDM   Final diagnoses:  None    Sean [name] is a 44 y.o. male here with R flank pain. Likely renal colic from 6 mm stone. Bedside US showed mild R hydro. Xray showed 5x7 mm UPJ stone. Will check labs and UA and consult urology.   11:24 PM Still in pain despite multiple rounds of morphine. Urology consulted and recommend US renal and likely admit.      Wandra Arthurs, MD 03/24/15 [phone]

## 2015-03-25 ENCOUNTER — Observation Stay (HOSPITAL_COMMUNITY): Payer: BLUE CROSS/BLUE SHIELD | Admitting: Anesthesiology

## 2015-03-25 ENCOUNTER — Encounter (HOSPITAL_COMMUNITY): Payer: Self-pay | Admitting: Anesthesiology

## 2015-03-25 ENCOUNTER — Encounter (HOSPITAL_COMMUNITY)
Admission: EM | Disposition: A | Discharge status: Home / Self Care | Payer: Self-pay | Source: Home / Self Care | Attending: Urology

## 2015-03-25 ENCOUNTER — Observation Stay (HOSPITAL_COMMUNITY): Payer: BLUE CROSS/BLUE SHIELD

## 2015-03-25 DIAGNOSIS — N23 Unspecified renal colic: Secondary | ICD-10-CM | POA: Diagnosis present

## 2015-03-25 DIAGNOSIS — N200 Calculus of kidney: Secondary | ICD-10-CM

## 2015-03-25 HISTORY — PX: CYSTOSCOPY W/ URETERAL STENT PLACEMENT: SHX1429

## 2015-03-25 SURGERY — CYSTOSCOPY, FLEXIBLE, WITH STENT REPLACEMENT
Anesthesia: General | Site: Ureter | Laterality: Right

## 2015-03-25 MED ORDER — ONDANSETRON HCL 4 MG/2ML IJ SOLN
4.0000 mg | Freq: Once | INTRAMUSCULAR | Status: DC
Start: 2015-03-25 — End: 2015-03-25

## 2015-03-25 MED ORDER — SODIUM CHLORIDE 0.9 % IV SOLN: INTRAVENOUS | Status: DC

## 2015-03-25 MED ORDER — ONDANSETRON HCL 4 MG/2ML IJ SOLN: 4.0000 mg | Freq: Four times a day (QID) | INTRAMUSCULAR | Status: DC

## 2015-03-25 MED ORDER — IPRATROPIUM BROMIDE 0.06 % NA SOLN
2.0000 | Freq: Four times a day (QID) | NASAL | Status: DC
  Filled 2015-03-25: qty 15

## 2015-03-25 MED ORDER — DIPHENHYDRAMINE HCL 12.5 MG/5ML PO ELIX
12.5000 mg | ORAL_SOLUTION | Freq: Four times a day (QID) | ORAL | Status: DC | PRN
Start: 2015-03-25 — End: 2015-03-25

## 2015-03-25 MED ORDER — LIDOCAINE HCL 2 % EX GEL
CUTANEOUS | Status: AC
  Filled 2015-03-25: qty 10

## 2015-03-25 MED ORDER — KETOROLAC TROMETHAMINE 15 MG/ML IJ SOLN
INTRAMUSCULAR | Status: AC
  Administered 2015-03-25: 15 mg
  Filled 2015-03-25: qty 1

## 2015-03-25 MED ORDER — LIDOCAINE HCL (CARDIAC) 20 MG/ML IV SOLN
INTRAVENOUS | Status: AC
  Filled 2015-03-25: qty 5

## 2015-03-25 MED ORDER — IOHEXOL 300 MG/ML  SOLN
INTRAMUSCULAR | Status: DC | PRN
  Administered 2015-03-25: 2 mL

## 2015-03-25 MED ORDER — PROPOFOL 10 MG/ML IV BOLUS
INTRAVENOUS | Status: DC | PRN
  Administered 2015-03-25: 300 mg via INTRAVENOUS

## 2015-03-25 MED ORDER — OXYCODONE-ACETAMINOPHEN 5-325 MG PO TABS: 1.0000 | ORAL_TABLET | ORAL | Status: DC | PRN

## 2015-03-25 MED ORDER — TAMSULOSIN HCL 0.4 MG PO CAPS
0.4000 mg | ORAL_CAPSULE | Freq: Every day | ORAL | Status: DC
  Filled 2015-03-25: qty 1

## 2015-03-25 MED ORDER — ONDANSETRON HCL 4 MG/2ML IJ SOLN
4.0000 mg | INTRAMUSCULAR | Status: DC | PRN
  Administered 2015-03-25 (×2): 4 mg via INTRAVENOUS
  Filled 2015-03-25: qty 2

## 2015-03-25 MED ORDER — ONDANSETRON HCL 4 MG/2ML IJ SOLN
INTRAMUSCULAR | Status: AC
  Filled 2015-03-25: qty 2

## 2015-03-25 MED ORDER — CIPROFLOXACIN IN D5W 400 MG/200ML IV SOLN
400.0000 mg | Freq: Once | INTRAVENOUS | Status: AC
  Administered 2015-03-25: 400 mg via INTRAVENOUS

## 2015-03-25 MED ORDER — ONDANSETRON HCL 4 MG/2ML IJ SOLN: 4.0000 mg | Freq: Four times a day (QID) | INTRAMUSCULAR | Status: DC | PRN

## 2015-03-25 MED ORDER — FENTANYL CITRATE (PF) 100 MCG/2ML IJ SOLN
INTRAMUSCULAR | Status: DC | PRN
  Administered 2015-03-25 (×2): 50 ug via INTRAVENOUS

## 2015-03-25 MED ORDER — CIPROFLOXACIN HCL 250 MG PO TABS: 250.0000 mg | ORAL_TABLET | Freq: Two times a day (BID) | ORAL | Status: DC

## 2015-03-25 MED ORDER — CIPROFLOXACIN IN D5W 400 MG/200ML IV SOLN
INTRAVENOUS | Status: AC
  Filled 2015-03-25: qty 200

## 2015-03-25 MED ORDER — PROMETHAZINE HCL 25 MG/ML IJ SOLN: 6.2500 mg | INTRAMUSCULAR | Status: DC | PRN

## 2015-03-25 MED ORDER — FENTANYL CITRATE (PF) 250 MCG/5ML IJ SOLN
INTRAMUSCULAR | Status: AC
  Filled 2015-03-25: qty 5

## 2015-03-25 MED ORDER — PROMETHAZINE HCL 25 MG/ML IJ SOLN: 12.5000 mg | Freq: Four times a day (QID) | INTRAMUSCULAR | Status: DC | PRN

## 2015-03-25 MED ORDER — PROPOFOL 10 MG/ML IV BOLUS
INTRAVENOUS | Status: AC
  Filled 2015-03-25: qty 20

## 2015-03-25 MED ORDER — DIPHENHYDRAMINE HCL 50 MG/ML IJ SOLN: 12.5000 mg | Freq: Four times a day (QID) | INTRAMUSCULAR | Status: DC | PRN

## 2015-03-25 MED ORDER — MIDAZOLAM HCL 2 MG/2ML IJ SOLN
INTRAMUSCULAR | Status: AC
  Filled 2015-03-25: qty 2

## 2015-03-25 MED ORDER — IPRATROPIUM BROMIDE 0.03 % NA SOLN
2.0000 | Freq: Four times a day (QID) | NASAL | Status: DC
  Administered 2015-03-25: 2 via NASAL
  Filled 2015-03-25: qty 30

## 2015-03-25 MED ORDER — SENNOSIDES-DOCUSATE SODIUM 8.6-50 MG PO TABS: 1.0000 | ORAL_TABLET | Freq: Every evening | ORAL | Status: DC | PRN

## 2015-03-25 MED ORDER — MORPHINE SULFATE 2 MG/ML IJ SOLN
2.0000 mg | INTRAMUSCULAR | Status: DC | PRN
  Administered 2015-03-25: 4 mg via INTRAVENOUS
  Administered 2015-03-25 (×2): 2 mg via INTRAVENOUS
  Filled 2015-03-25: qty 1
  Filled 2015-03-25: qty 2
  Filled 2015-03-25: qty 1

## 2015-03-25 MED ORDER — LACTATED RINGERS IV SOLN
INTRAVENOUS | Status: DC
  Administered 2015-03-25: 12:00:00 via INTRAVENOUS
  Administered 2015-03-25: 1000 mL via INTRAVENOUS

## 2015-03-25 MED ORDER — MORPHINE SULFATE 2 MG/ML IJ SOLN
2.0000 mg | INTRAMUSCULAR | Status: DC | PRN
Start: 2015-03-25 — End: 2015-03-26
  Administered 2015-03-25 – 2015-03-26 (×2): 2 mg via INTRAVENOUS
  Filled 2015-03-25 (×3): qty 1

## 2015-03-25 MED ORDER — HYDROCODONE-ACETAMINOPHEN 5-325 MG PO TABS
1.0000 | ORAL_TABLET | Freq: Four times a day (QID) | ORAL | Status: DC | PRN
  Administered 2015-03-25 – 2015-03-26 (×2): 2 via ORAL
  Filled 2015-03-25 (×2): qty 2

## 2015-03-25 MED ORDER — HYDROCODONE-ACETAMINOPHEN 5-325 MG PO TABS: 1.0000 | ORAL_TABLET | Freq: Four times a day (QID) | ORAL | Status: DC | PRN

## 2015-03-25 MED ORDER — KETOROLAC TROMETHAMINE 30 MG/ML IJ SOLN
15.0000 mg | Freq: Four times a day (QID) | INTRAMUSCULAR | Status: DC
  Administered 2015-03-25: 15 mg via INTRAVENOUS
  Filled 2015-03-25: qty 1

## 2015-03-25 MED ORDER — FENTANYL CITRATE (PF) 100 MCG/2ML IJ SOLN: 25.0000 ug | INTRAMUSCULAR | Status: DC | PRN

## 2015-03-25 MED ORDER — SODIUM CHLORIDE 0.9 % IR SOLN
Status: DC | PRN
  Administered 2015-03-25: 3000 mL

## 2015-03-25 MED ORDER — LIDOCAINE HCL (CARDIAC) 20 MG/ML IV SOLN
INTRAVENOUS | Status: DC | PRN
  Administered 2015-03-25: 80 mg via INTRAVENOUS

## 2015-03-25 MED ORDER — BELLADONNA ALKALOIDS-OPIUM 16.2-60 MG RE SUPP
RECTAL | Status: AC
  Filled 2015-03-25: qty 1

## 2015-03-25 MED ORDER — SODIUM CHLORIDE 0.9 % IV SOLN
INTRAVENOUS | Status: DC
  Administered 2015-03-25 (×2): via INTRAVENOUS

## 2015-03-25 SURGICAL SUPPLY — 7 items
BAG URO CATCHER STRL LF (DRAPE) ×3 IMPLANT
CATH URET 5FR 28IN OPEN ENDED (CATHETERS) ×3 IMPLANT
GLOVE BIOGEL M STRL SZ7.5 (GLOVE) ×3 IMPLANT
GOWN STRL REUS W/TWL XL LVL3 (GOWN DISPOSABLE) ×3 IMPLANT
GUIDEWIRE STR DUAL SENSOR (WIRE) ×3 IMPLANT
PACK CYSTO (CUSTOM PROCEDURE TRAY) ×3 IMPLANT
STENT POLARIS 6FR 26CM .038 (STENTS) ×3 IMPLANT

## 2015-03-25 NOTE — Anesthesia Procedure Notes (Signed)
Procedure Name: LMA Insertion Date/Time: 03/25/2015 12:45 PM Performed by: Glory Buff Pre-anesthesia Checklist: Patient identified, Emergency Drugs available, Suction available, Patient being monitored and Timeout performed Patient Re-evaluated:Patient Re-evaluated prior to inductionOxygen Delivery Method: Circle system utilized Preoxygenation: Pre-oxygenation with 100% oxygen Intubation Type: IV induction LMA: LMA inserted LMA Size: 4.0 Number of attempts: 1 Tube secured with: Tape

## 2015-03-25 NOTE — Care Management Note (Signed)
Case Management Note  Patient Details  Name: DEONTAYE CIVELLO MRN: [id number] Date of Birth: 1971/09/20  Subjective/Objective:   R renal colic                 Action/Plan:From home.   Expected Discharge Date:                Expected Discharge Plan:  Home/Self Care  In-House Referral:     Discharge planning Services  CM Consult  Post Acute Care Choice:    Choice offered to:     DME Arranged:    DME Agency:     HH Arranged:    HH Agency:     Status of Service:  In process, will continue to follow  Medicare Important Message Given:    Date Medicare IM Given:    Medicare IM give by:    Date Additional Medicare IM Given:    Additional Medicare Important Message give by:     If discussed at [facility] of Stay Meetings, dates discussed:    Additional Comments:d/c home no needs.  Dessa Phi, RN 03/25/2015, 2:59 PM

## 2015-03-25 NOTE — Progress Notes (Signed)
Looks much better with one shot of morphine Mild penis end of penis Spoke with nurses No flank pain I was a bit surprised regarding wife's question/expectations post op when i spoke to her; in spite of being very clear on instructions/expectations she went on to call my office 3x to schedule ESWL  i will address it with her in am if I see her Will speak to Dr Diona Fanti

## 2015-03-25 NOTE — H&P (Signed)
H&P  Chief Complaint: Right renal colic and intractable pain, nausea and vomiting  History of Present Illness: Sean [name] is a 44 y.o. male with a history of testis cancer status post treatment and new diagnosis of right sided 6 mm UPJ stone. He was seen in clinic this past week with right sided flank pain and a CT scan illustrated a right sided 6 mm UPJ stone. He was discharged on medical expulsive therapy. This evening, his pain became more severe and was associated with uncontrollable nausea and vomiting. He can be the emergency room where he was found to be afebrile with vital signs within normal limits and stable. We are consulted for further management.  He reports that he has not been having any fevers at home however is unable to keep down any fluids. His pain is improved with Toradol and narcotic. He does not believe that he can manage this discomfort at home.  KUB was performed in the emergency room which showed a right-sided UPJ stone as expected. Ultrasound was performed which did not show any significant hydronephrosis.  Past Medical History  Diagnosis Date  . Cancer 2003    testicular    History reviewed. No pertinent past surgical history.  Home Medications:    Medication List    ASK your doctor about these medications        aspirin EC 81 MG tablet  Take 81 mg by mouth daily.     chlorpheniramine-HYDROcodone 10-8 MG/5ML Lqcr  Commonly known as:  TUSSIONEX PENNKINETIC ER  Take 5 mLs by mouth every 12 (twelve) hours as needed for cough.     ibuprofen 200 MG tablet  Commonly known as:  ADVIL,MOTRIN  Take 800 mg by mouth every 6 (six) hours as needed for moderate pain.     ipratropium 0.06 % nasal spray  Commonly known as:  ATROVENT  Place 2 sprays into both nostrils 4 (four) times daily.     oxyCODONE-acetaminophen 5-325 MG per tablet  Commonly known as:  PERCOCET/ROXICET  Take 1 tablet by mouth every 4 (four) hours as needed. pain     promethazine 12.5 MG  tablet  Commonly known as:  PHENERGAN  Take 1 tablet (12.5 mg total) by mouth every 6 (six) hours as needed for nausea.     pseudoephedrine 60 MG tablet  Commonly known as:  SUDAFED  1 po tid for congestion     tamsulosin 0.4 MG Caps capsule  Commonly known as:  FLOMAX  Take 1 capsule by mouth daily.        Allergies:  Allergies  Allergen Reactions  . Hydromorphone Hcl Nausea And Vomiting    DILAUDID    No family history on file.  Social History:  reports that he has quit smoking. He does not have any smokeless tobacco history on file. He reports that he drinks alcohol. He reports that he does not use illicit drugs.  ROS: A complete review of systems was performed.  All systems are negative except for pertinent findings as noted.  Physical Exam:  Vital signs in last 24 hours: Pulse Rate:  [72] 72 (05/11 2131) BP: (113)/(69) 113/69 mmHg (05/11 2131) SpO2:  [100 %] 100 % (05/11 2131) Constitutional:  Alert and oriented, No acute distress Cardiovascular: Regular rate and rhythm, No JVD Respiratory: Normal respiratory effort, Lungs clear bilaterally GI: Abdomen is soft, nontender, nondistended, no abdominal masses GU: Right CVA tenderness Lymphatic: No lymphadenopathy Neurologic: Grossly intact, no focal deficits Psychiatric: Normal mood and  affect  Laboratory Data:   Recent Labs  03/24/15 2208  WBC 7.8  HGB 15.7  HCT 47.2  PLT 245     Recent Labs  03/24/15 2208  NA 141  K 4.2  CL 104  GLUCOSE 133*  BUN 13  CALCIUM 9.6  CREATININE 1.15     Results for orders placed or performed during the hospital encounter of 03/24/15 (from the past 24 hour(s))  CBC with Differential     Status: None   Collection Time: 03/24/15 10:08 PM  Result Value Ref Range   WBC 7.8 4.0 - 10.5 K/uL   RBC 5.35 4.22 - 5.81 MIL/uL   Hemoglobin 15.7 13.0 - 17.0 g/dL   HCT 47.2 39.0 - 52.0 %   MCV 88.2 78.0 - 100.0 fL   MCH 29.3 26.0 - 34.0 pg   MCHC 33.3 30.0 - 36.0 g/dL    RDW 13.2 11.5 - 15.5 %   Platelets 245 150 - 400 K/uL   Neutrophils Relative % 51 43 - 77 %   Neutro Abs 3.9 1.7 - 7.7 K/uL   Lymphocytes Relative 35 12 - 46 %   Lymphs Abs 2.8 0.7 - 4.0 K/uL   Monocytes Relative 11 3 - 12 %   Monocytes Absolute 0.9 0.1 - 1.0 K/uL   Eosinophils Relative 2 0 - 5 %   Eosinophils Absolute 0.2 0.0 - 0.7 K/uL   Basophils Relative 1 0 - 1 %   Basophils Absolute 0.1 0.0 - 0.1 K/uL  Comprehensive metabolic panel     Status: Abnormal   Collection Time: 03/24/15 10:08 PM  Result Value Ref Range   Sodium 141 135 - 145 mmol/L   Potassium 4.2 3.5 - 5.1 mmol/L   Chloride 104 101 - 111 mmol/L   CO2 25 22 - 32 mmol/L   Glucose, Bld 133 (H) 70 - 99 mg/dL   BUN 13 6 - 20 mg/dL   Creatinine, Ser 1.15 0.61 - 1.24 mg/dL   Calcium 9.6 8.9 - 10.3 mg/dL   Total Protein 7.4 6.5 - 8.1 g/dL   Albumin 4.3 3.5 - 5.0 g/dL   AST 30 15 - 41 U/L   ALT 55 17 - 63 U/L   Alkaline Phosphatase 61 38 - 126 U/L   Total Bilirubin 0.8 0.3 - 1.2 mg/dL   GFR calc non Af Amer >60 >60 mL/min   GFR calc Af Amer >60 >60 mL/min   Anion gap 12 5 - 15  Urinalysis, Routine w reflex microscopic     Status: Abnormal   Collection Time: 03/24/15 11:18 PM  Result Value Ref Range   Color, Urine BROWN (A) YELLOW   APPearance CLOUDY (A) CLEAR   Specific Gravity, Urine 1.024 1.005 - 1.030   pH 6.5 5.0 - 8.0   Glucose, UA NEGATIVE NEGATIVE mg/dL   Hgb urine dipstick LARGE (A) NEGATIVE   Bilirubin Urine NEGATIVE NEGATIVE   Ketones, ur NEGATIVE NEGATIVE mg/dL   Protein, ur 100 (A) NEGATIVE mg/dL   Urobilinogen, UA 0.2 0.0 - 1.0 mg/dL   Nitrite NEGATIVE NEGATIVE   Leukocytes, UA TRACE (A) NEGATIVE  Urine microscopic-add on     Status: Abnormal   Collection Time: 03/24/15 11:18 PM  Result Value Ref Range   Squamous Epithelial / LPF RARE RARE   WBC, UA 0-2 <3 WBC/hpf   RBC / HPF TOO NUMEROUS TO COUNT <3 RBC/hpf   Crystals CA OXALATE CRYSTALS (A) NEGATIVE   Urine-Other MUCOUS PRESENT  No  results found for this or any previous visit (from the past 240 hour(s)).  Renal Function:  Recent Labs  03/24/15 2208  CREATININE 1.15   CrCl cannot be calculated (Unknown ideal weight.).  Radiologic Imaging: Dg Abd 1 View  03/24/2015   CLINICAL DATA:  Known right nephrolithiasis. Pain for 3 days, unbearable tonight  EXAM: ABDOMEN - 1 VIEW  COMPARISON:  None.  FINDINGS: There is a 5 x 7 mm calcification adjacent to the right L3 transverse process. This likely represents a urinary calculus at the right ureteropelvic junction or in the proximal right ureter. There is a 2 mm calcification superimposed on the left kidney, likely a midpole calculus. Visible portions of the abdominal gas pattern are unremarkable.  IMPRESSION: 1. Probable 5 x 7 mm right UPJ calculus 2. Probable 2 mm midpole left collecting system calculus   Electronically Signed   By: Andreas Newport M.D.   On: 03/24/2015 22:37    Impression/Assessment:  44 year old with right renal colic pain and right sided 6 mm UPJ stone without obstruction but with intractable pain, nausea and vomiting. No evidence of infection at this time. Patient is afebrile and stable.  Plan:  Admit to urology IV fluids Toradol and narcotic for pain control Flomax Nothing by mouth after midnight tonight for possible intervention tomorrow Hold aspirin Zofran and Phenergan for nausea

## 2015-03-25 NOTE — Progress Notes (Signed)
Still uncomfortable Chart reviewed  Discussed ESWL and stent in detail vs. Watchful witing After a thorough review of the management options for the patient's condition the patient  elected to proceed with surgical therapy as noted above. We have discussed the potential benefits and risks of the procedure, side effects of the proposed treatment, the likelihood of the patient achieving the goals of the procedure, and any potential problems that might occur during the procedure or recuperation. Informed consent has been obtained.    I performed a history and physical examination of the patient and discussed his management with the resident.  I reviewed the resident's note and agree with the documented findings and plan of care   Px: still looks uncomfortable Non toxic  He has elected a stent and stage ESWL

## 2015-03-25 NOTE — H&P (View-Only) (Signed)
Still uncomfortable Chart reviewed  Discussed ESWL and stent in detail vs. Watchful witing After a thorough review of the management options for the patient's condition the patient  elected to proceed with surgical therapy as noted above. We have discussed the potential benefits and risks of the procedure, side effects of the proposed treatment, the likelihood of the patient achieving the goals of the procedure, and any potential problems that might occur during the procedure or recuperation. Informed consent has been obtained.    I performed a history and physical examination of the patient and discussed his management with the resident.  I reviewed the resident's note and agree with the documented findings and plan of care   Px: still looks uncomfortable Non toxic  He has elected a stent and stage ESWL

## 2015-03-25 NOTE — Progress Notes (Signed)
Pt complaining of pain/burning with urination. Pt states he also feels increased pain where they placed the stent when he urinates. Pt was given vicodin to help with pain control. Will hold morphine unless pain begins to increase without urination. Pain is a 3/4 when at rest and 8/9 with urination. Will continue to monitor.

## 2015-03-25 NOTE — Interval H&P Note (Signed)
History and Physical Interval Note:  03/25/2015 12:33 PM  Sean [name]  has presented today for surgery, with the diagnosis of right ureteral obstruction  The various methods of treatment have been discussed with the patient and family. After consideration of risks, benefits and other options for treatment, the patient has consented to  Procedure(s): CYSTOSCOPY WITH STENT REPLACEMENT (Right) as a surgical intervention .  The patient's history has been reviewed, patient examined, no change in status, stable for surgery.  I have reviewed the patient's chart and labs.  Questions were answered to the patient's satisfaction.     Naketa Daddario A

## 2015-03-25 NOTE — Anesthesia Postprocedure Evaluation (Signed)
Anesthesia Post Note  Patient: Sean [name]  Procedure(s) Performed: Procedure(s) (LRB): CYSTOSCOPY, RETROGRADE PYELOGRAM  WITH RIGHT URETERAL STENT PLACEMENT (Right)  Anesthesia type: General  Patient location: PACU  Post pain: Pain level controlled and Adequate analgesia  Post assessment: Post-op Vital signs reviewed, Patient's Cardiovascular Status Stable, Respiratory Function Stable, Patent Airway and Pain level controlled  Last Vitals:  Filed Vitals:   03/25/15 1408  BP:   Pulse: 67  Temp:   Resp: 20    Post vital signs: Reviewed and stable  Level of consciousness: awake, alert  and oriented  Complications: No apparent anesthesia complications

## 2015-03-25 NOTE — Op Note (Signed)
Preoperative diagnosis: Right renal stone and right colic Postoperative diagnosis: Right renal colic and renal stone Surgery: Right retrograde ureterogram and cystoscopy and insertion of right ureteral stent Surgeon: Dr. Nicki Reaper Jama Krichbaum  The the patient has the above diagnoses and consented above procedure. Preoperative antibodies were given.  Leg position was excellent minimize the risk of compartment syndrome and neuropathy and deep vein thrombosis. Fluoroscopy was utilized.  A 21 [place] scope was utilized. Penile bulbar membranous and prostatic urethra normal. He grade 1 and a 4 bladder trabeculation and the trigone was normal. There is no periureteral edema.  Under fluoroscopic guidance I passed a sensor wire easily to the mid upper ureter. I then passed open-end ureteral catheter over it.  Retrograde ureterogram: After removing the sensor wire I did a gentle retrograde ureterogram using 3-4 mL of contrast. I could see the stone ureter at the ureteropelvic junction. I could see mildly dilated calyces.  I then passed a sensor wire into the right upper pole calyx. I remove the open-end ureteral catheter.  A well-prepared 26 cm x 6 French double-J stent without the string was easily passed under cystoscopic and fluoroscopic guidance curling up in the upper pole calyx and in the bladder. There was a typical volcano affect through the stent. Procedure was uneventful. Hopefully it will relieve the patient's pain

## 2015-03-25 NOTE — Discharge Instructions (Signed)
I have reviewed discharge instructions in detail with the patient. They will follow-up with me or their physician as scheduled. My nurse will also be calling the patients as per protocol.   

## 2015-03-25 NOTE — Transfer of Care (Signed)
Immediate Anesthesia Transfer of Care Note  Patient: Sean [name]  Procedure(s) Performed: Procedure(s): CYSTOSCOPY, RETROGRADE PYELOGRAM  WITH RIGHT URETERAL STENT PLACEMENT (Right)  Patient Location: PACU  Anesthesia Type:General  Level of Consciousness: awake, alert  and oriented  Airway & Oxygen Therapy: Patient Spontanous Breathing and Patient connected to face mask oxygen  Post-op Assessment: Report given to RN and Post -op Vital signs reviewed and stable  Post vital signs: Reviewed and stable  Last Vitals:  Filed Vitals:   03/25/15 0900  BP:   Pulse:   Temp:   Resp: 18    Complications: No apparent anesthesia complications

## 2015-03-25 NOTE — Anesthesia Preprocedure Evaluation (Addendum)
Anesthesia Evaluation  Patient identified by MRN, date of birth, ID band Patient awake    Reviewed: Allergy & Precautions, NPO status , Patient's Chart, lab work & pertinent test results  History of Anesthesia Complications Negative for: history of anesthetic complications  Airway Mallampati: III  TM Distance: >3 FB Neck ROM: Full    Dental  (+) Teeth Intact, Dental Advisory Given   Pulmonary former smoker,    Pulmonary exam normal       Cardiovascular negative cardio ROS Normal cardiovascular exam    Neuro/Psych negative neurological ROS  negative psych ROS   GI/Hepatic negative GI ROS, Neg liver ROS,   Endo/Other  Morbid obesity  Renal/GU      Musculoskeletal   Abdominal   Peds  Hematology negative hematology ROS (+)   Anesthesia Other Findings   Reproductive/Obstetrics                            Anesthesia Physical Anesthesia Plan  ASA: II  Anesthesia Plan: General   Post-op Pain Management:    Induction: Intravenous  Airway Management Planned: LMA  Additional Equipment:   Intra-op Plan:   Post-operative Plan: Extubation in OR  Informed Consent: I have reviewed the patients History and Physical, chart, labs and discussed the procedure including the risks, benefits and alternatives for the proposed anesthesia with the patient or authorized representative who has indicated his/her understanding and acceptance.   Dental advisory given  Plan Discussed with: CRNA, Anesthesiologist and Surgeon  Anesthesia Plan Comments:         Anesthesia Quick Evaluation

## 2015-03-26 ENCOUNTER — Other Ambulatory Visit: Payer: Self-pay | Admitting: Urology

## 2015-03-26 ENCOUNTER — Encounter (HOSPITAL_COMMUNITY): Payer: Self-pay | Admitting: Urology

## 2015-03-26 NOTE — Discharge Summary (Signed)
Date of admission: 03/24/2015  Date of discharge: 03/26/2015  Admission diagnosis: Right renal stone  Discharge diagnosis: Rt renal stone  Secondary diagnoses: none  History and Physical: For full details, please see admission history and physical. Briefly, Sean [name] is a 44 y.o. year old patient with the above diagnosis.   Hospital Course: Cysto/stent uneventful after being admitted for pain control. Post op pain so kept in for additional evening. Ok next day  Laboratory values:  Recent Labs  03/24/15 2208  HGB 15.7  HCT 47.2    Recent Labs  03/24/15 2208  CREATININE 1.15    Disposition: Home  Discharge instruction: The patient was instructed to be ambulatory but told to refrain from heavy lifting, strenuous activity, or driving. Described detailed  Discharge medications:    Medication List    STOP taking these medications        aspirin EC 81 MG tablet     ibuprofen 200 MG tablet  Commonly known as:  ADVIL,MOTRIN     oxyCODONE-acetaminophen 5-325 MG per tablet  Commonly known as:  PERCOCET/ROXICET     tamsulosin 0.4 MG Caps capsule  Commonly known as:  FLOMAX      TAKE these medications        chlorpheniramine-HYDROcodone 10-8 MG/5ML Lqcr  Commonly known as:  TUSSIONEX PENNKINETIC ER  Take 5 mLs by mouth every 12 (twelve) hours as needed for cough.     ciprofloxacin 250 MG tablet  Commonly known as:  CIPRO  Take 1 tablet (250 mg total) by mouth 2 (two) times daily.     HYDROcodone-acetaminophen 5-325 MG per tablet  Commonly known as:  NORCO  Take 1-2 tablets by mouth every 6 (six) hours as needed for moderate pain.     ipratropium 0.06 % nasal spray  Commonly known as:  ATROVENT  Place 2 sprays into both nostrils 4 (four) times daily.     promethazine 12.5 MG tablet  Commonly known as:  PHENERGAN  Take 1 tablet (12.5 mg total) by mouth every 6 (six) hours as needed for nausea.     pseudoephedrine 60 MG tablet  Commonly known as:  SUDAFED   1 po tid for congestion        Followup:      Follow-up Information    Follow up with Chaye Misch A, MD.   Specialty:  Urology   Why:  as scheduled   Contact information:   [place] [address] [phone]       Follow up with Jorja Loa, MD.   Specialty:  Urology   Why:  as scheduled   Contact information:   [place] [address] [phone]

## 2015-03-26 NOTE — Progress Notes (Signed)
Pt has voided multiple times this shift. Pt complains of burning with urination and develops some shaking from the pain. At rest his pain is low, but during and after urination he expresses discomfort. Will report to dayshift.

## 2015-03-26 NOTE — H&P (Signed)
History of Present Illness Sean [name] is here today for further management of a right upper ureteral stone. He has had intermittent pain for a couple of weeks now. CT scan performed in early May revealed an 8 mm right UPJ stone. That became more symptomatic. 2 days ago, with significant pain, nausea and vomiting. That necessitated an emergency room visit and eventually cystoscopy and a double-J stent placement by Dr. Matilde Sprang yesterday. He is more comfortable now. He has not had fever or chills. He has intermittent gross hematuria. The stent really doesn't bother him much, except during voiding. He is no longer having nausea or vomiting.   Past Medical History Problems  1. History of Testicular Cancer  Surgical History Problems  1. History of Orchiectomy Left  Current Meds 1. Advil TABS; prn;  Therapy: (Recorded:08Oct2014) to Recorded 2. Aspirin 81 MG TABS;  Therapy: (Recorded:08Oct2014) to Recorded 3. Oxycodone-Acetaminophen 5-325 MG Oral Tablet; TAKE 1 TO 2 TABLETS EVERY 4 TO 6  HOURS AS NEEDED;  Therapy: 09FGH8299 to (Evaluate:07May2016); Last [id number] Ordered 4. Promethazine HCl - 25 MG Oral Tablet; TAKE 1 TABLET EVERY 4 TO 6 HOURS AS  NEEDED FOR NAUSEA;  Therapy: 10FBP1025 to (Evaluate:06May2016)  Requested for: 85IDP8242; Last  [id number] Ordered 5. Tamsulosin HCl - 0.4 MG Oral Capsule; TAKE 1 CAPSULE Daily;  Therapy: 00QQP6195 to (Last [id number])  Requested for: 09XIP3825 Ordered  Allergies Medication  1. Dilaudid TABS  Family History Problems  1. Family history of Family Health Status Of Father - Alive 2. Family history of Family Health Status Of Mother - Alive 3. Family history of Tuberculosis : Maternal Grandfather 4. Family history of Urologic Disorder   kidney stones  Social History Problems  1. Denied: Alcohol Use 2. Caffeine Use   4 3. Former smoker [phone])   smoked for 15 years; quit 13 years ago 73. Marital History - Currently Married 5.  Occupation:   petrolium driver 6. History of Tobacco Use   currently using chewing tobacco  Review of Systems Genitourinary, constitutional, skin, eye, otolaryngeal, hematologic/lymphatic, cardiovascular, pulmonary, endocrine, musculoskeletal, gastrointestinal, neurological and psychiatric system(s) were reviewed and pertinent findings if present are noted and are otherwise negative.  Genitourinary: hematuria.  Gastrointestinal: nausea, vomiting and flank pain.  Constitutional: feeling poorly (malaise).    Vitals Vital Signs [Data Includes: Last 1 Day]  Recorded: 73ALP3790 10:59AM  Blood Pressure: 122 / 80 Temperature: 97.5 F Heart Rate: 73  Physical Exam Constitutional: Well nourished and well developed . No acute distress.   ENT:. The ears and nose are normal in appearance.   Neck: The appearance of the neck is normal and no neck mass is present.   Pulmonary: No respiratory distress and normal respiratory rhythm and effort.   Abdomen: The abdomen is rounded.   Skin: Normal skin turgor, no visible rash and no visible skin lesions.   Neuro/Psych:. Mood and affect are appropriate.    Results/Data Urine [Data Includes: Last 1 Day]   [id number]  COLOR BROWN   APPEARANCE TURBID   SPECIFIC GRAVITY 1.015   pH 5.0   GLUCOSE NEG mg/dL  BILIRUBIN SMALL   KETONE NEG mg/dL  BLOOD LARGE   PROTEIN 100 mg/dL  UROBILINOGEN 0.2 mg/dL  NITRITE NEG   LEUKOCYTE ESTERASE TRACE   SQUAMOUS EPITHELIAL/HPF NONE SEEN   WBC 0-2 WBC/hpf  RBC TNTC RBC/hpf  BACTERIA FEW   CRYSTALS NONE SEEN   CASTS NONE SEEN    Urinalysis revealed hematuria-no evidence of infection. I reviewed. CT scan images as  well as most recent KUB. Skin to stone distance is approximately 14 cm. Hounsfield units approximately 800.   Assessment Right upper ureteral/UPJ stone, status post stenting-patient now more comfortable   Plan Calculus of ureter  1. Start: Oxybutynin Chloride 5 MG Oral Tablet; TAKE 1 TABLET  3 times daily PRN urinary  frequency 2. Follow-up Office  Follow-up  Status: Hold For - Date of Service  Requested for:  23May2016 3. Follow-up Schedule Surgery Office  Follow-up  Status: Complete  Done: [id number] 4. KUB; Status:Hold For - Date of Service; Requested for:23May2016;  Health Maintenance  5. UA With REFLEX; [Do Not Release]; Status:Complete;   Done: 44WNU2725 10:45AM  Discussion/Summary I had a long discussion with Spike and his wife. We discussed treatment options-ureteroscopy with holmium laser and extraction of stone versus lithotripsy. Stone free rates of approximately 95 and 85%, respectively with 1 treatment, were discussed. They understand these. He is able to be scheduled for lithotripsy on Monday-he would prefer that. Dr. Risa Grill will be his treating physician-I will discuss the patient with him.   Signatures Electronically signed by : [name], M.D.; Mar 26 2015 12:19PM EST

## 2015-03-26 NOTE — Progress Notes (Signed)
Patient much better Vitals OK Was very polite, calm but concisely went thru course of events with pt and his wife regarding events and my surprise concerning Explained my side of story and recognized patient's side Michela Pitcher i would relay message to Dr D and also that family would greatly appreciate ESWL ASAP Wife apologized saying she has a friend in our clinic

## 2015-03-29 ENCOUNTER — Ambulatory Visit (HOSPITAL_COMMUNITY)
Admission: RE | Admit: 2015-03-29 | Discharge: 2015-03-29 | Disposition: A | Discharge status: Home / Self Care | Payer: BLUE CROSS/BLUE SHIELD | Source: Ambulatory Visit | Attending: Urology | Admitting: Urology

## 2015-03-29 ENCOUNTER — Encounter (HOSPITAL_COMMUNITY)
Admission: RE | Disposition: A | Discharge status: Home / Self Care | Payer: Self-pay | Source: Ambulatory Visit | Attending: Urology

## 2015-03-29 ENCOUNTER — Ambulatory Visit (HOSPITAL_COMMUNITY): Payer: BLUE CROSS/BLUE SHIELD

## 2015-03-29 ENCOUNTER — Encounter (HOSPITAL_COMMUNITY): Payer: Self-pay | Admitting: *Deleted

## 2015-03-29 DIAGNOSIS — N201 Calculus of ureter: Secondary | ICD-10-CM | POA: Diagnosis not present

## 2015-03-29 DIAGNOSIS — Z79899 Other long term (current) drug therapy: Secondary | ICD-10-CM | POA: Diagnosis not present

## 2015-03-29 DIAGNOSIS — Z6838 Body mass index (BMI) 38.0-38.9, adult: Secondary | ICD-10-CM | POA: Insufficient documentation

## 2015-03-29 DIAGNOSIS — Z8547 Personal history of malignant neoplasm of testis: Secondary | ICD-10-CM | POA: Insufficient documentation

## 2015-03-29 DIAGNOSIS — Z791 Long term (current) use of non-steroidal anti-inflammatories (NSAID): Secondary | ICD-10-CM | POA: Diagnosis not present

## 2015-03-29 DIAGNOSIS — E669 Obesity, unspecified: Secondary | ICD-10-CM | POA: Insufficient documentation

## 2015-03-29 DIAGNOSIS — Z9079 Acquired absence of other genital organ(s): Secondary | ICD-10-CM | POA: Diagnosis not present

## 2015-03-29 DIAGNOSIS — Z7982 Long term (current) use of aspirin: Secondary | ICD-10-CM | POA: Diagnosis not present

## 2015-03-29 DIAGNOSIS — Z87891 Personal history of nicotine dependence: Secondary | ICD-10-CM | POA: Insufficient documentation

## 2015-03-29 SURGERY — LITHOTRIPSY, ESWL
Anesthesia: LOCAL | Laterality: Right

## 2015-03-29 MED ORDER — LEVOFLOXACIN IN D5W 500 MG/100ML IV SOLN
500.0000 mg | INTRAVENOUS | Status: AC
  Administered 2015-03-29: 500 mg via INTRAVENOUS
  Filled 2015-03-29: qty 100

## 2015-03-29 MED ORDER — DIAZEPAM 5 MG PO TABS
10.0000 mg | ORAL_TABLET | ORAL | Status: AC
  Administered 2015-03-29: 10 mg via ORAL
  Filled 2015-03-29: qty 2

## 2015-03-29 MED ORDER — DIPHENHYDRAMINE HCL 25 MG PO CAPS
25.0000 mg | ORAL_CAPSULE | ORAL | Status: AC
  Administered 2015-03-29: 25 mg via ORAL
  Filled 2015-03-29: qty 1

## 2015-03-29 MED ORDER — SODIUM CHLORIDE 0.9 % IV SOLN
INTRAVENOUS | Status: DC
  Administered 2015-03-29: 1000 mL via INTRAVENOUS
  Administered 2015-03-29: 12:00:00 via INTRAVENOUS

## 2015-03-29 NOTE — Discharge Instructions (Signed)
See [facility] discharge instructions in chart.  

## 2015-03-29 NOTE — Op Note (Signed)
See [facility] note scanned into chart. 

## 2015-03-29 NOTE — Interval H&P Note (Signed)
History and Physical Interval Note:  03/29/2015 10:41 AM  Sean [name]  has presented today for surgery, with the diagnosis of RIGHT URETERAL STONE  The various methods of treatment have been discussed with the patient and family. After consideration of risks, benefits and other options for treatment, the patient has consented to  Procedure(s): RIGHT EXTRACORPOREAL SHOCK WAVE LITHOTRIPSY (ESWL) (Right) as a surgical intervention .  The patient's history has been reviewed, patient examined, no change in status, stable for surgery.  I have reviewed the patient's chart and labs.  Questions were answered to the patient's satisfaction.     [name]

## 2015-07-01 ENCOUNTER — Emergency Department (HOSPITAL_COMMUNITY)
Admission: EM | Admit: 2015-07-01 | Discharge: 2015-07-02 | Disposition: A | Discharge status: Home / Self Care | Payer: BLUE CROSS/BLUE SHIELD | Attending: Emergency Medicine | Admitting: Emergency Medicine

## 2015-07-01 ENCOUNTER — Encounter (HOSPITAL_COMMUNITY): Payer: Self-pay | Admitting: *Deleted

## 2015-07-01 DIAGNOSIS — Z8547 Personal history of malignant neoplasm of testis: Secondary | ICD-10-CM | POA: Diagnosis not present

## 2015-07-01 DIAGNOSIS — Z87442 Personal history of urinary calculi: Secondary | ICD-10-CM | POA: Diagnosis not present

## 2015-07-01 DIAGNOSIS — N23 Unspecified renal colic: Secondary | ICD-10-CM

## 2015-07-01 DIAGNOSIS — R112 Nausea with vomiting, unspecified: Secondary | ICD-10-CM | POA: Diagnosis not present

## 2015-07-01 DIAGNOSIS — Z87891 Personal history of nicotine dependence: Secondary | ICD-10-CM | POA: Diagnosis not present

## 2015-07-01 DIAGNOSIS — R1030 Lower abdominal pain, unspecified: Secondary | ICD-10-CM | POA: Diagnosis present

## 2015-07-01 HISTORY — DX: Calculus of kidney: N20.0

## 2015-07-01 LAB — COMPREHENSIVE METABOLIC PANEL
ALT: 57 U/L (ref 17–63)
ANION GAP: 11 (ref 5–15)
AST: 39 U/L (ref 15–41)
Albumin: 4 g/dL (ref 3.5–5.0)
Alkaline Phosphatase: 60 U/L (ref 38–126)
BILIRUBIN TOTAL: 0.5 mg/dL (ref 0.3–1.2)
BUN: 10 mg/dL (ref 6–20)
CO2: 23 mmol/L (ref 22–32)
Calcium: 9.2 mg/dL (ref 8.9–10.3)
Chloride: 106 mmol/L (ref 101–111)
Creatinine, Ser: 1.13 mg/dL (ref 0.61–1.24)
Glucose, Bld: 120 mg/dL — ABNORMAL HIGH (ref 65–99)
POTASSIUM: 3.7 mmol/L (ref 3.5–5.1)
Sodium: 140 mmol/L (ref 135–145)
TOTAL PROTEIN: 6.8 g/dL (ref 6.5–8.1)

## 2015-07-01 LAB — CBC
HEMATOCRIT: 45.4 % (ref 39.0–52.0)
HEMOGLOBIN: 15.5 g/dL (ref 13.0–17.0)
MCH: 29.2 pg (ref 26.0–34.0)
MCHC: 34.1 g/dL (ref 30.0–36.0)
MCV: 85.5 fL (ref 78.0–100.0)
Platelets: 238 10*3/uL (ref 150–400)
RBC: 5.31 MIL/uL (ref 4.22–5.81)
RDW: 13.3 % (ref 11.5–15.5)
WBC: 7.6 10*3/uL (ref 4.0–10.5)

## 2015-07-01 LAB — LIPASE, BLOOD: Lipase: 42 U/L (ref 22–51)

## 2015-07-01 MED ORDER — MORPHINE SULFATE (PF) 4 MG/ML IV SOLN
4.0000 mg | Freq: Once | INTRAVENOUS | Status: AC
  Administered 2015-07-01: 4 mg via INTRAVENOUS
  Filled 2015-07-01: qty 1

## 2015-07-01 MED ORDER — ONDANSETRON 4 MG PO TBDP
4.0000 mg | ORAL_TABLET | Freq: Once | ORAL | Status: AC | PRN
  Administered 2015-07-01: 4 mg via ORAL

## 2015-07-01 MED ORDER — METOCLOPRAMIDE HCL 5 MG/ML IJ SOLN
10.0000 mg | Freq: Once | INTRAMUSCULAR | Status: AC
  Administered 2015-07-01: 10 mg via INTRAVENOUS
  Filled 2015-07-01: qty 2

## 2015-07-01 MED ORDER — OXYCODONE-ACETAMINOPHEN 5-325 MG PO TABS
ORAL_TABLET | ORAL | Status: AC
  Filled 2015-07-01: qty 1

## 2015-07-01 MED ORDER — OXYCODONE-ACETAMINOPHEN 5-325 MG PO TABS: 1.0000 | ORAL_TABLET | Freq: Once | ORAL | Status: AC

## 2015-07-01 MED ORDER — KETOROLAC TROMETHAMINE 30 MG/ML IJ SOLN
30.0000 mg | Freq: Once | INTRAMUSCULAR | Status: AC
  Administered 2015-07-01: 30 mg via INTRAVENOUS
  Filled 2015-07-01: qty 1

## 2015-07-01 MED ORDER — ONDANSETRON 4 MG PO TBDP
ORAL_TABLET | ORAL | Status: AC
  Filled 2015-07-01: qty 1

## 2015-07-01 MED ORDER — ONDANSETRON HCL 4 MG/2ML IJ SOLN
4.0000 mg | Freq: Once | INTRAMUSCULAR | Status: AC
  Administered 2015-07-01: 4 mg via INTRAVENOUS
  Filled 2015-07-01: qty 2

## 2015-07-01 NOTE — ED Provider Notes (Signed)
CSN: [id number]     Arrival date & time 07/01/15  2223 History   First MD Initiated Contact with Patient 07/01/15 2259     Chief Complaint  Patient presents with  . Flank Pain     (Consider location/radiation/quality/duration/timing/severity/associated sxs/prior Treatment) HPI Sean [name] is a 44 y.o. male with history of testicular cancer, cholecystectomy and kidney stones, comes in for evaluation of flank pain. Patient reports approximately 1-1/2 hours ago he had sudden onset left flank pain similar to previous kidney stone pain. He has associated nausea and vomiting. Denies fevers, chills, back pain. He does report taking his urine was "a little pink" in the waiting room before arrival. Denies dysuria, rectal pain, groin pain. No other aggravating or modifying factors. Patient reports he is a patient of [facility], Dr. Diona Fanti and will be able to follow-up  Past Medical History  Diagnosis Date  . Cancer 2003    testicular  . Kidney stones    Past Surgical History  Procedure Laterality Date  . Cystoscopy w/ ureteral stent placement Right 03/25/2015    Procedure: CYSTOSCOPY, RETROGRADE PYELOGRAM  WITH RIGHT URETERAL STENT PLACEMENT;  Surgeon: [name], MD;  Location: [facility];  Service: Urology;  Laterality: Right;  . Surgery scrotal / testicular  2003    for testicular cancer   History reviewed. No pertinent family history. Social History  Substance Use Topics  . Smoking status: Former Research scientist (life sciences)  . Smokeless tobacco: None  . Alcohol Use: Yes    Review of Systems A 10 point review of systems was completed and was negative except for pertinent positives and negatives as mentioned in the history of present illness     Allergies  Bee venom; Shellfish allergy; and Hydromorphone hcl  Home Medications   Prior to Admission medications   Medication Sig Start Date End Date Taking? Authorizing Provider  ciprofloxacin (CIPRO) 250 MG tablet Take 1 tablet (250 mg  total) by mouth 2 (two) times daily. Patient not taking: Reported on 03/29/2015 03/25/15   [name], MD  HYDROcodone-acetaminophen (NORCO) 5-325 MG per tablet Take 1-2 tablets by mouth every 6 (six) hours as needed for moderate pain. 03/25/15   [name], MD  oxybutynin (DITROPAN) 5 MG tablet Take 5 mg by mouth 3 (three) times daily as needed for bladder spasms.    Historical Provider, MD   BP 144/83 mmHg  Pulse 72  Temp(Src) 97.8 F (36.6 C) (Oral)  Resp 15  SpO2 100% Physical Exam  Constitutional: He is oriented to person, place, and time. He appears well-developed and well-nourished.  HENT:  Head: Normocephalic and atraumatic.  Mouth/Throat: Oropharynx is clear and moist.  Eyes: Conjunctivae are normal. Pupils are equal, round, and reactive to light. Right eye exhibits no discharge. Left eye exhibits no discharge. No scleral icterus.  Neck: Neck supple.  Cardiovascular: Normal rate, regular rhythm and normal heart sounds.   Pulmonary/Chest: Effort normal and breath sounds normal. No respiratory distress. He has no wheezes. He has no rales.  Abdominal: Soft. He exhibits no distension and no mass. There is no tenderness. There is no rebound and no guarding.  No CVA tenderness. Abdomen soft, nondistended and nontender.  Musculoskeletal: He exhibits no tenderness.  Neurological: He is alert and oriented to person, place, and time.  Cranial Nerves II-XII grossly intact  Skin: Skin is warm and dry. No rash noted.  Psychiatric: He has a normal mood and affect.  Nursing note and vitals reviewed.   ED Course  Procedures (including critical care time) Labs Review Labs Reviewed  LIPASE, BLOOD  COMPREHENSIVE METABOLIC PANEL  CBC  URINALYSIS, ROUTINE W REFLEX MICROSCOPIC (NOT AT [facility])    Imaging Review No results found. I have personally reviewed and evaluated these images and lab results as part of my medical decision-making.   EKG Interpretation None     Meds given  in ED:  Medications  ondansetron (ZOFRAN-ODT) disintegrating tablet 4 mg (4 mg Oral Given 07/01/15 2236)  oxyCODONE-acetaminophen (PERCOCET/ROXICET) 5-325 MG per tablet 1 tablet (0 tablets Oral Return to [facility] 07/01/15 2245)  morphine 4 MG/ML injection 4 mg (4 mg Intravenous Given 07/01/15 2322)  ondansetron (ZOFRAN) injection 4 mg (4 mg Intravenous Given 07/01/15 2322)  ketorolac (TORADOL) 30 MG/ML injection 30 mg (30 mg Intravenous Given 07/01/15 2341)  metoCLOPramide (REGLAN) injection 10 mg (10 mg Intravenous Given 07/01/15 2341)  oxyCODONE-acetaminophen (PERCOCET/ROXICET) 5-325 MG per tablet 2 tablet (2 tablets Oral Given 07/02/15 0119)    New Prescriptions   METOCLOPRAMIDE (REGLAN) 10 MG TABLET    Take 1 tablet (10 mg total) by mouth every 6 (six) hours.   OXYCODONE-ACETAMINOPHEN (PERCOCET/ROXICET) 5-325 MG PER TABLET    Take 1-2 tablets by mouth every 4 (four) hours as needed for severe pain.   Filed Vitals:   07/02/15 0100 07/02/15 0115 07/02/15 0130 07/02/15 0215  BP: 126/69 121/78 113/68 129/57  Pulse: 67 58 75 85  Temp:    98 F (36.7 C)  TempSrc:    Oral  Resp:    16  SpO2: 92% 96% 93% 95%    MDM  Patient presents with left-sided flank pain similar to previous pain experienced with ureteral stones. Vitals stable - WNL -afebrile Pt resting comfortably in ED.  PE--benign abdominal exam, no CVA tenderness.  Low suspicion for pyelonephritis, renal abscess, other acute intra-abdominal pathology. Patient with likely stone, pain controlled in the ED with combination of Toradol and Reglan. Pending labs and renal ultrasound. Care transferred to oncoming provider, Dr. Jola Schmidt for follow-up on labs and imaging. If negative objective findings, patient may be discharged home to follow-up with his urologist. Patient stable, in good condition and is appropriate for sign out.   Final diagnoses:  Renal colic on left side       Comer Locket, PA-C 07/04/15 O'Brien, MD 07/04/15 [phone]

## 2015-07-01 NOTE — ED Notes (Signed)
Pt c/o sudden onset of left flank pain about a hour and a half ago. Pt vomited multiple times on the way to the ED. Pt denies dysuria, hematuria, pt reports hx of kidney stones.

## 2015-07-02 ENCOUNTER — Emergency Department (HOSPITAL_COMMUNITY): Payer: BLUE CROSS/BLUE SHIELD

## 2015-07-02 LAB — URINALYSIS, ROUTINE W REFLEX MICROSCOPIC
Glucose, UA: NEGATIVE mg/dL
Ketones, ur: 15 mg/dL — AB
NITRITE: NEGATIVE
PROTEIN: 100 mg/dL — AB
SPECIFIC GRAVITY, URINE: 1.026 (ref 1.005–1.030)
UROBILINOGEN UA: 0.2 mg/dL (ref 0.0–1.0)
pH: 6 (ref 5.0–8.0)

## 2015-07-02 LAB — URINE MICROSCOPIC-ADD ON

## 2015-07-02 MED ORDER — OXYCODONE-ACETAMINOPHEN 5-325 MG PO TABS
2.0000 | ORAL_TABLET | Freq: Once | ORAL | Status: AC
  Administered 2015-07-02: 2 via ORAL
  Filled 2015-07-02: qty 2

## 2015-07-02 MED ORDER — METOCLOPRAMIDE HCL 10 MG PO TABS: 10.0000 mg | ORAL_TABLET | Freq: Four times a day (QID) | ORAL | Status: DC

## 2015-07-02 MED ORDER — OXYCODONE-ACETAMINOPHEN 5-325 MG PO TABS: 1.0000 | ORAL_TABLET | ORAL | Status: DC | PRN

## 2015-07-02 NOTE — ED Notes (Signed)
Patient returned from US.

## 2015-07-02 NOTE — Discharge Instructions (Signed)
You were evaluated in the ED today for your left flank pain. Your likely suffering from a kidney stone. Please follow-up with your urologist, Dr. Diona Fanti within the next 2 days for further evaluation and management of your symptoms. Please take your pain medicine and nausea medicine as needed for severe discomfort. Return to ED for new or worsening symptoms.  Kidney Stones Kidney stones (urolithiasis) are deposits that form inside your kidneys. The intense pain is caused by the stone moving through the urinary tract. When the stone moves, the ureter goes into spasm around the stone. The stone is usually passed in the urine.  CAUSES   A disorder that makes certain neck glands produce too much parathyroid hormone (primary hyperparathyroidism).  A buildup of uric acid crystals, similar to gout in your joints.  Narrowing (stricture) of the ureter.  A kidney obstruction present at birth (congenital obstruction).  Previous surgery on the kidney or ureters.  Numerous kidney infections. SYMPTOMS   Feeling sick to your stomach (nauseous).  Throwing up (vomiting).  Blood in the urine (hematuria).  Pain that usually spreads (radiates) to the groin.  Frequency or urgency of urination. DIAGNOSIS   Taking a history and physical exam.  Blood or urine tests.  CT scan.  Occasionally, an examination of the inside of the urinary bladder (cystoscopy) is performed. TREATMENT   Observation.  Increasing your fluid intake.  Extracorporeal shock wave lithotripsy--This is a noninvasive procedure that uses shock waves to break up kidney stones.  Surgery may be needed if you have severe pain or persistent obstruction. There are various surgical procedures. Most of the procedures are performed with the use of small instruments. Only small incisions are needed to accommodate these instruments, so recovery time is minimized. The size, location, and chemical composition are all important variables  that will determine the proper choice of action for you. Talk to your health care provider to better understand your situation so that you will minimize the risk of injury to yourself and your kidney.  HOME CARE INSTRUCTIONS   Drink enough water and fluids to keep your urine clear or pale yellow. This will help you to pass the stone or stone fragments.  Strain all urine through the provided strainer. Keep all particulate matter and stones for your health care provider to see. The stone causing the pain may be as small as a grain of salt. It is very important to use the strainer each and every time you pass your urine. The collection of your stone will allow your health care provider to analyze it and verify that a stone has actually passed. The stone analysis will often identify what you can do to reduce the incidence of recurrences.  Only take over-the-counter or prescription medicines for pain, discomfort, or fever as directed by your health care provider.  Make a follow-up appointment with your health care provider as directed.  Get follow-up X-rays if required. The absence of pain does not always mean that the stone has passed. It may have only stopped moving. If the urine remains completely obstructed, it can cause loss of kidney function or even complete destruction of the kidney. It is your responsibility to make sure X-rays and follow-ups are completed. Ultrasounds of the kidney can show blockages and the status of the kidney. Ultrasounds are not associated with any radiation and can be performed easily in a matter of minutes. SEEK MEDICAL CARE IF:  You experience pain that is progressive and unresponsive to any pain medicine  you have been prescribed. SEEK IMMEDIATE MEDICAL CARE IF:   Pain cannot be controlled with the prescribed medicine.  You have a fever or shaking chills.  The severity or intensity of pain increases over 18 hours and is not relieved by pain medicine.  You develop a  new onset of abdominal pain.  You feel faint or pass out.  You are unable to urinate. MAKE SURE YOU:   Understand these instructions.  Will watch your condition.  Will get help right away if you are not doing well or get worse. Document Released: 10/30/2005 Document Revised: 07/02/2013 Document Reviewed: 04/02/2013 [facility] Patient Information 2015 [place], [place]. This information is not intended to replace advice given to you by your health care provider. Make sure you discuss any questions you have with your health care provider.

## 2015-07-02 NOTE — ED Notes (Signed)
Pt verbalized understanding of d/c instructions and has no further questions. Pt stable and NAD.  

## 2015-07-04 ENCOUNTER — Observation Stay (HOSPITAL_COMMUNITY)
Admission: EM | Admit: 2015-07-04 | Discharge: 2015-07-05 | Disposition: A | Discharge status: Home / Self Care | Payer: BLUE CROSS/BLUE SHIELD | Attending: Urology | Admitting: Urology

## 2015-07-04 ENCOUNTER — Observation Stay (HOSPITAL_COMMUNITY): Payer: BLUE CROSS/BLUE SHIELD | Admitting: Anesthesiology

## 2015-07-04 ENCOUNTER — Encounter (HOSPITAL_COMMUNITY): Payer: Self-pay | Admitting: Emergency Medicine

## 2015-07-04 ENCOUNTER — Emergency Department (HOSPITAL_COMMUNITY): Payer: BLUE CROSS/BLUE SHIELD

## 2015-07-04 ENCOUNTER — Encounter (HOSPITAL_COMMUNITY)
Admission: EM | Disposition: A | Discharge status: Home / Self Care | Payer: Self-pay | Source: Home / Self Care | Attending: Emergency Medicine

## 2015-07-04 DIAGNOSIS — Z87891 Personal history of nicotine dependence: Secondary | ICD-10-CM | POA: Insufficient documentation

## 2015-07-04 DIAGNOSIS — N139 Obstructive and reflux uropathy, unspecified: Secondary | ICD-10-CM | POA: Insufficient documentation

## 2015-07-04 DIAGNOSIS — N23 Unspecified renal colic: Secondary | ICD-10-CM | POA: Diagnosis present

## 2015-07-04 DIAGNOSIS — Z87442 Personal history of urinary calculi: Secondary | ICD-10-CM | POA: Diagnosis not present

## 2015-07-04 DIAGNOSIS — N201 Calculus of ureter: Principal | ICD-10-CM | POA: Diagnosis present

## 2015-07-04 DIAGNOSIS — Z8547 Personal history of malignant neoplasm of testis: Secondary | ICD-10-CM | POA: Diagnosis not present

## 2015-07-04 HISTORY — PX: CYSTOSCOPY WITH RETROGRADE PYELOGRAM, URETEROSCOPY AND STENT PLACEMENT: SHX5789

## 2015-07-04 LAB — CBC
HCT: 44 % (ref 39.0–52.0)
Hemoglobin: 14.5 g/dL (ref 13.0–17.0)
MCH: 28.6 pg (ref 26.0–34.0)
MCHC: 33 g/dL (ref 30.0–36.0)
MCV: 86.8 fL (ref 78.0–100.0)
PLATELETS: 211 10*3/uL (ref 150–400)
RBC: 5.07 MIL/uL (ref 4.22–5.81)
RDW: 13.3 % (ref 11.5–15.5)
WBC: 14.2 10*3/uL — AB (ref 4.0–10.5)

## 2015-07-04 LAB — BASIC METABOLIC PANEL
ANION GAP: 10 (ref 5–15)
BUN: 17 mg/dL (ref 6–20)
CALCIUM: 8.8 mg/dL — AB (ref 8.9–10.3)
CO2: 20 mmol/L — AB (ref 22–32)
CREATININE: 1.59 mg/dL — AB (ref 0.61–1.24)
Chloride: 105 mmol/L (ref 101–111)
GFR calc Af Amer: 59 mL/min — ABNORMAL LOW (ref >60)
GFR, EST NON AFRICAN AMERICAN: 51 mL/min — AB (ref >60)
GLUCOSE: 112 mg/dL — AB (ref 65–99)
Potassium: 4.4 mmol/L (ref 3.5–5.1)
Sodium: 135 mmol/L (ref 135–145)

## 2015-07-04 LAB — URINALYSIS, ROUTINE W REFLEX MICROSCOPIC
BILIRUBIN URINE: NEGATIVE
GLUCOSE, UA: NEGATIVE mg/dL
Ketones, ur: NEGATIVE mg/dL
Nitrite: NEGATIVE
Protein, ur: NEGATIVE mg/dL
SPECIFIC GRAVITY, URINE: 1.014 (ref 1.005–1.030)
UROBILINOGEN UA: 0.2 mg/dL (ref 0.0–1.0)
pH: 6 (ref 5.0–8.0)

## 2015-07-04 LAB — URINE MICROSCOPIC-ADD ON

## 2015-07-04 SURGERY — CYSTOURETEROSCOPY, WITH RETROGRADE PYELOGRAM AND STENT INSERTION
Anesthesia: General | Site: Perineum | Laterality: Left

## 2015-07-04 MED ORDER — CEFAZOLIN SODIUM-DEXTROSE 2-3 GM-% IV SOLR
2.0000 g | INTRAVENOUS | Status: AC
Start: 2015-07-04 — End: 2015-07-04
  Administered 2015-07-04: 2 g via INTRAVENOUS

## 2015-07-04 MED ORDER — SODIUM CHLORIDE 0.9 % IV BOLUS (SEPSIS)
1000.0000 mL | Freq: Once | INTRAVENOUS | Status: AC
  Administered 2015-07-04: 1000 mL via INTRAVENOUS

## 2015-07-04 MED ORDER — SULFAMETHOXAZOLE-TRIMETHOPRIM 400-80 MG PO TABS
1.0000 | ORAL_TABLET | Freq: Two times a day (BID) | ORAL | Status: DC
  Administered 2015-07-04 – 2015-07-05 (×2): 1 via ORAL
  Filled 2015-07-04 (×3): qty 1

## 2015-07-04 MED ORDER — PROPOFOL 10 MG/ML IV BOLUS
INTRAVENOUS | Status: DC | PRN
  Administered 2015-07-04: 100 mg via INTRAVENOUS
  Administered 2015-07-04: 200 mg via INTRAVENOUS

## 2015-07-04 MED ORDER — FENTANYL CITRATE (PF) 100 MCG/2ML IJ SOLN
50.0000 ug | INTRAMUSCULAR | Status: DC | PRN
  Administered 2015-07-04: 100 ug via INTRAVENOUS
  Filled 2015-07-04 (×2): qty 2

## 2015-07-04 MED ORDER — KETOROLAC TROMETHAMINE 30 MG/ML IJ SOLN
30.0000 mg | Freq: Four times a day (QID) | INTRAMUSCULAR | Status: DC
  Administered 2015-07-04 – 2015-07-05 (×4): 30 mg via INTRAVENOUS
  Filled 2015-07-04 (×4): qty 1

## 2015-07-04 MED ORDER — MIDAZOLAM HCL 2 MG/2ML IJ SOLN
INTRAMUSCULAR | Status: AC
  Filled 2015-07-04: qty 4

## 2015-07-04 MED ORDER — ONDANSETRON 4 MG PO TBDP: 4.0000 mg | ORAL_TABLET | Freq: Once | ORAL | Status: DC | PRN

## 2015-07-04 MED ORDER — SODIUM CHLORIDE 0.9 % IR SOLN
Status: DC | PRN
  Administered 2015-07-04: 3000 mL

## 2015-07-04 MED ORDER — SUCCINYLCHOLINE CHLORIDE 20 MG/ML IJ SOLN
INTRAMUSCULAR | Status: DC | PRN
  Administered 2015-07-04: 100 mg via INTRAVENOUS

## 2015-07-04 MED ORDER — FENTANYL CITRATE (PF) 100 MCG/2ML IJ SOLN: 25.0000 ug | INTRAMUSCULAR | Status: DC | PRN

## 2015-07-04 MED ORDER — PROMETHAZINE HCL 25 MG/ML IJ SOLN: 6.2500 mg | INTRAMUSCULAR | Status: DC | PRN

## 2015-07-04 MED ORDER — CEFAZOLIN SODIUM 1 G IJ SOLR: 2.0000 g | Freq: Once | INTRAMUSCULAR | Status: DC

## 2015-07-04 MED ORDER — LIDOCAINE HCL (CARDIAC) 20 MG/ML IV SOLN
INTRAVENOUS | Status: AC
Start: 2015-07-04 — End: 2015-07-04
  Filled 2015-07-04: qty 10

## 2015-07-04 MED ORDER — LACTATED RINGERS IV SOLN
INTRAVENOUS | Status: DC | PRN
  Administered 2015-07-04: 18:00:00 via INTRAVENOUS

## 2015-07-04 MED ORDER — ONDANSETRON HCL 4 MG/2ML IJ SOLN
4.0000 mg | Freq: Once | INTRAMUSCULAR | Status: AC | PRN
  Administered 2015-07-04: 4 mg via INTRAVENOUS
  Filled 2015-07-04: qty 2

## 2015-07-04 MED ORDER — OXYBUTYNIN CHLORIDE 5 MG PO TABS
5.0000 mg | ORAL_TABLET | Freq: Three times a day (TID) | ORAL | Status: DC | PRN
  Administered 2015-07-04 – 2015-07-05 (×2): 5 mg via ORAL
  Filled 2015-07-04 (×2): qty 1

## 2015-07-04 MED ORDER — DEXTROSE IN LACTATED RINGERS 5 % IV SOLN
INTRAVENOUS | Status: DC
  Administered 2015-07-04: 14:00:00 via INTRAVENOUS

## 2015-07-04 MED ORDER — SULFAMETHOXAZOLE-TRIMETHOPRIM 800-160 MG PO TABS: 1.0000 | ORAL_TABLET | Freq: Two times a day (BID) | ORAL | Status: DC

## 2015-07-04 MED ORDER — FENTANYL CITRATE (PF) 100 MCG/2ML IJ SOLN
25.0000 ug | INTRAMUSCULAR | Status: DC | PRN
  Administered 2015-07-04 (×3): 50 ug via INTRAVENOUS
  Administered 2015-07-04: 100 ug via INTRAVENOUS
  Administered 2015-07-04 – 2015-07-05 (×5): 50 ug via INTRAVENOUS
  Filled 2015-07-04 (×7): qty 2

## 2015-07-04 MED ORDER — FENTANYL CITRATE (PF) 100 MCG/2ML IJ SOLN
50.0000 ug | Freq: Once | INTRAMUSCULAR | Status: AC
  Administered 2015-07-04: 50 ug via INTRAVENOUS

## 2015-07-04 MED ORDER — MIDAZOLAM HCL 5 MG/5ML IJ SOLN
INTRAMUSCULAR | Status: DC | PRN
  Administered 2015-07-04: 2 mg via INTRAVENOUS

## 2015-07-04 MED ORDER — IOHEXOL 300 MG/ML  SOLN
INTRAMUSCULAR | Status: DC | PRN
  Administered 2015-07-04: 5 mL via INTRAVENOUS

## 2015-07-04 MED ORDER — CEFAZOLIN SODIUM-DEXTROSE 2-3 GM-% IV SOLR
INTRAVENOUS | Status: AC
  Filled 2015-07-04: qty 50

## 2015-07-04 MED ORDER — ONDANSETRON HCL 4 MG/2ML IJ SOLN
4.0000 mg | INTRAMUSCULAR | Status: DC | PRN
  Administered 2015-07-04 (×2): 4 mg via INTRAVENOUS

## 2015-07-04 MED ORDER — FENTANYL CITRATE (PF) 100 MCG/2ML IJ SOLN
INTRAMUSCULAR | Status: AC
  Filled 2015-07-04: qty 4

## 2015-07-04 MED ORDER — LIDOCAINE HCL (CARDIAC) 20 MG/ML IV SOLN
INTRAVENOUS | Status: DC | PRN
  Administered 2015-07-04: 50 mg via INTRAVENOUS

## 2015-07-04 MED ORDER — PROPOFOL 10 MG/ML IV BOLUS
INTRAVENOUS | Status: AC
  Filled 2015-07-04: qty 20

## 2015-07-04 MED ORDER — FENTANYL CITRATE (PF) 100 MCG/2ML IJ SOLN
50.0000 ug | Freq: Once | INTRAMUSCULAR | Status: AC
  Administered 2015-07-04: 50 ug via INTRAVENOUS
  Filled 2015-07-04: qty 2

## 2015-07-04 MED ORDER — DEXTROSE IN LACTATED RINGERS 5 % IV SOLN
INTRAVENOUS | Status: DC
  Administered 2015-07-04 – 2015-07-05 (×2): via INTRAVENOUS

## 2015-07-04 SURGICAL SUPPLY — 21 items
BAG URO CATCHER STRL LF (DRAPE) ×3 IMPLANT
CATH INTERMIT  6FR 70CM (CATHETERS) ×3 IMPLANT
CLOTH BEACON ORANGE TIMEOUT ST (SAFETY) ×3 IMPLANT
EXTRACTOR STONE NITINOL NGAGE (UROLOGICAL SUPPLIES) ×3 IMPLANT
FIBER LASER FLEXIVA 1000 (UROLOGICAL SUPPLIES) IMPLANT
FIBER LASER FLEXIVA 200 (UROLOGICAL SUPPLIES) ×3 IMPLANT
FIBER LASER FLEXIVA 365 (UROLOGICAL SUPPLIES) IMPLANT
FIBER LASER FLEXIVA 550 (UROLOGICAL SUPPLIES) IMPLANT
FIBER LASER TRAC TIP (UROLOGICAL SUPPLIES) IMPLANT
GLOVE BIOGEL M 8.0 STRL (GLOVE) ×3 IMPLANT
GOWN STRL REUS W/TWL XL LVL3 (GOWN DISPOSABLE) ×3 IMPLANT
GUIDEWIRE STR DUAL SENSOR (WIRE) ×3 IMPLANT
KIT BALLIN UROMAX 15FX10 (LABEL) ×1 IMPLANT
MANIFOLD NEPTUNE II (INSTRUMENTS) ×3 IMPLANT
PACK CYSTO (CUSTOM PROCEDURE TRAY) ×3 IMPLANT
SET HIGH PRES BAL DIL (LABEL) ×2
SHEATH ACCESS URETERAL 38CM (SHEATH) ×3 IMPLANT
STENT URET 6FRX24 CONTOUR (STENTS) ×3 IMPLANT
TUBING CONNECTING 10 (TUBING) ×2 IMPLANT
TUBING CONNECTING 10' (TUBING) ×1
WIRE COONS/BENSON .038X145CM (WIRE) IMPLANT

## 2015-07-04 NOTE — Anesthesia Preprocedure Evaluation (Addendum)
Anesthesia Evaluation  Patient identified by MRN, date of birth, ID band Patient awake    Reviewed: Allergy & Precautions, NPO status , Patient's Chart, lab work & pertinent test results  Airway Mallampati: II  TM Distance: >3 FB Neck ROM: Full    Dental no notable dental hx.    Pulmonary former smoker,  breath sounds clear to auscultation  Pulmonary exam normal       Cardiovascular negative cardio ROS Normal cardiovascular examRhythm:Regular Rate:Normal     Neuro/Psych negative neurological ROS  negative psych ROS   GI/Hepatic negative GI ROS, Neg liver ROS,   Endo/Other  negative endocrine ROS  Renal/GU Renal diseaseCr. 1.59 K 4.4  negative genitourinary   Musculoskeletal negative musculoskeletal ROS (+)   Abdominal (+) + obese,   Peds negative pediatric ROS (+)  Hematology negative hematology ROS (+)   Anesthesia Other Findings   Reproductive/Obstetrics negative OB ROS                          Anesthesia Physical Anesthesia Plan  ASA: II  Anesthesia Plan: General   Post-op Pain Management:    Induction: Intravenous  Airway Management Planned: Oral ETT  Additional Equipment:   Intra-op Plan:   Post-operative Plan: Extubation in OR  Informed Consent: I have reviewed the patients History and Physical, chart, labs and discussed the procedure including the risks, benefits and alternatives for the proposed anesthesia with the patient or authorized representative who has indicated his/her understanding and acceptance.   Dental advisory given  Plan Discussed with: CRNA  Anesthesia Plan Comments:         Anesthesia Quick Evaluation

## 2015-07-04 NOTE — Op Note (Signed)
Preoperative diagnosis: 4 mm left midureteral stone  Postoperative diagnosis: Same   Procedure: Cystoscopy, left retrograde ureteropyelogram with interpretive fluoroscopy, left ureteroscopy  , extraction of left ureteral stone following holmium laser lithotripsy, placement of left double-J stent-24 cm x 6 French contour stent with string  Surgeon: Lillette Boxer. Dahlstedt, M.D.   Anesthesia: Gen.   Complications: None  Specimen(s): Stone fragments, the patient's wife  Drain(s): None  Indications: 44 year old male with persistently symptomatic left ureteral stone, found to be in the mid ureter on CT scan this morning. He has no other ureteral or renal calculi. The patient was scheduled for procedure this morning, but unfortunately he did eat at the time of his evaluation. We are delaying his treatment to now to allow for emptying of the stomach. The patient has been instructed in the procedure of ureteroscopy, possible laser and extraction of left ureteral stone. Risks and complications have been discussed. He understands these and desires to proceed.    Technique and findings: The patient was properly identified in the holding area. He received 2 g of Ancef preoperatively. His left side was marked. He was taken to the operating room where general anesthetic was administered. He was laced in the dorsolithotomy position. Genitalia and perineum were prepped and draped. Proper timeout was performed.  A 23 French panendoscope was advanced under direct vision through his urethra. Urethra was normal, prostate nonobstructive. His bladder was entered and inspected circumferentially. There were no tumors foreign bodies are trabeculations. Ureteral orifices were normal in configuration and location.  A 6 French open-ended catheter was advanced into his left ureteral orifice. Retrograde ureteropyelogram was performed. This revealed a normal ureter up to the proximal ureter where a filling defect was seen with  mild hydroureteronephrosis proximal.  There was no filling defect within the pyelocalyceal system.  At this point, a 0.038 inchsensor-tip guidewire was negotiated through the open-ended catheter, into the ureter, past the stone and into the left upper pole calyx.  The open-ended catheter was removed.  Using the inner core of the 12/14 ureteral access sheath as well as a 10 cm, 15 French balloon dilator, the distal and mid ureter were dilated.  Following this, the ureteroscope was advanced directly to the urethra, into the left ureter and up to the stone.  The stone was large enough that did not think that could easily be engaged and extracted.  I used the 200  laser fiber to apply holmium energy to the stone which was then fragmented into multiple tiny granules, and one larger fragment which was then grasped with the engage basket and extracted.  Because there was mild ureteral trauma due to the dilation, I felt it worthwhile, after all fragments had washed out were extracted, to leave a double-J stent.  The ureteroscope was removed and cystoscopically, over the guidewire, a 24 cm x 6 [place], 4 double-J stent was placed, with the string left attached.  The bladder was drained, the scope removed, the string brought through the urethra and taped to the penis.  The patient tolerated procedure well.  The stone was given to the patient's wife.

## 2015-07-04 NOTE — ED Provider Notes (Signed)
CSN: [id number]     Arrival date & time 07/04/15  0547 History   First MD Initiated Contact with Patient 07/04/15 0602     Chief Complaint  Patient presents with  . Fever  . Flank Pain     (Consider location/radiation/quality/duration/timing/severity/associated sxs/prior Treatment) The history is provided by the patient, the spouse and medical records. No language interpreter was used.     Sean [name] is a 44 y.o. male  with a hx of testicular cancer, cholecystectomy and kidney stones presents to the Emergency Department complaining of gradual, persistent, progressively worsening left flank onset 4 days ago.  Pt was seen for same on 8/1/8 without evidence of infection or hydronephrosis . Associated symptoms include nausea and vomiting several times per day every time he attempts to eat.  Pt also with low grade fever to 100.0 at home.  He reports generalized weakness, left-sided abdominal pain.  Pt saw urology on Friday who reports likely only 1 stone.  Pt reports he was switched to vicodin on Friday which is helping with the pain, but it does not take the pain away. He reports taking Reglan without improvement in his nausea and vomiting. He reports he is more concerned about his fever, weakness and nausea.  Nothing makes it worse.  Pt denies headache, neck pain, chest pain, shortness of breath, diarrhea, syncope.   Pt reports he is urinating without difficulty.     Past Medical History  Diagnosis Date  . Cancer 2003    testicular  . Kidney stones    Past Surgical History  Procedure Laterality Date  . Cystoscopy w/ ureteral stent placement Right 03/25/2015    Procedure: CYSTOSCOPY, RETROGRADE PYELOGRAM  WITH RIGHT URETERAL STENT PLACEMENT;  Surgeon: [name], MD;  Location: [facility];  Service: Urology;  Laterality: Right;  . Surgery scrotal / testicular  2003    for testicular cancer   No family history on file. Social History  Substance Use Topics  . Smoking status: Former  Research scientist (life sciences)  . Smokeless tobacco: None  . Alcohol Use: Yes    Review of Systems  Constitutional: Negative for fever, diaphoresis, appetite change, fatigue and unexpected weight change.  HENT: Negative for mouth sores.   Eyes: Negative for visual disturbance.  Respiratory: Negative for cough, chest tightness, shortness of breath and wheezing.   Cardiovascular: Negative for chest pain.  Gastrointestinal: Positive for nausea, vomiting and abdominal pain ( left side). Negative for diarrhea and constipation.  Endocrine: Negative for polydipsia, polyphagia and polyuria.  Genitourinary: Positive for hematuria and flank pain ( left). Negative for dysuria, urgency and frequency.  Musculoskeletal: Negative for back pain and neck stiffness.  Skin: Negative for rash.  Allergic/Immunologic: Negative for immunocompromised state.  Neurological: Positive for weakness (generalized). Negative for syncope, light-headedness and headaches.  Hematological: Does not bruise/bleed easily.  Psychiatric/Behavioral: Negative for sleep disturbance. The patient is not nervous/anxious.       Allergies  Bee venom; Shellfish allergy; and Hydromorphone hcl  Home Medications   Prior to Admission medications   Medication Sig Start Date End Date Taking? Authorizing Provider  HYDROcodone-acetaminophen (NORCO/VICODIN) 5-325 MG per tablet Take 1 tablet by mouth every 6 (six) hours as needed for moderate pain.   Yes Historical Provider, MD  metoCLOPramide (REGLAN) 10 MG tablet Take 1 tablet (10 mg total) by mouth every 6 (six) hours. 07/02/15  Yes Comer Locket, PA-C  oxyCODONE-acetaminophen (PERCOCET/ROXICET) 5-325 MG per tablet Take 1-2 tablets by mouth every 4 (four) hours as needed  for severe pain. 07/02/15  Yes Benjamin Cartner, PA-C   BP 118/82 mmHg  Pulse 72  Temp(Src) 99.7 F (37.6 C) (Rectal)  Resp 18  Ht 5\' 9"  (1.753 m)  Wt 245 lb (111.131 kg)  BMI 36.16 kg/m2  SpO2 94% Physical Exam  Constitutional: He  appears well-developed and well-nourished. No distress.  Awake, alert, nontoxic appearance  HENT:  Head: Normocephalic and atraumatic.  Mouth/Throat: Oropharynx is clear and moist. No oropharyngeal exudate.  Eyes: Conjunctivae are normal. No scleral icterus.  Neck: Normal range of motion. Neck supple.  Cardiovascular: Normal rate, regular rhythm, normal heart sounds and intact distal pulses.   Pulmonary/Chest: Effort normal and breath sounds normal. No respiratory distress. He has no wheezes.  Equal chest expansion  Abdominal: Soft. Bowel sounds are normal. He exhibits no mass. There is tenderness in the left upper quadrant and left lower quadrant. There is no rebound, no guarding and no CVA tenderness.  Mild tenderness to palpation of the left lower and left upper quadrant without guarding or rebound No CVA tenderness  Musculoskeletal: Normal range of motion. He exhibits no edema.  Neurological: He is alert.  Speech is clear and goal oriented Moves extremities without ataxia  Skin: Skin is warm and dry. He is not diaphoretic.  Psychiatric: He has a normal mood and affect.  Nursing note and vitals reviewed.   ED Course  Procedures (including critical care time) Labs Review Labs Reviewed  URINALYSIS, ROUTINE W REFLEX MICROSCOPIC (NOT AT [facility]) - Abnormal; Notable for the following:    APPearance CLOUDY (*)    Hgb urine dipstick LARGE (*)    Leukocytes, UA TRACE (*)    All other components within normal limits  CBC - Abnormal; Notable for the following:    WBC 14.2 (*)    All other components within normal limits  BASIC METABOLIC PANEL - Abnormal; Notable for the following:    CO2 20 (*)    Glucose, Bld 112 (*)    Creatinine, Ser 1.59 (*)    Calcium 8.8 (*)    GFR calc non Af Amer 51 (*)    GFR calc Af Amer 59 (*)    All other components within normal limits  URINE MICROSCOPIC-ADD ON    Imaging Review Ct Renal Stone Study  07/04/2015   CLINICAL DATA:  LEFT flank pain,  history of kidney stones and cancer. Nausea and vomiting.  EXAM: CT ABDOMEN AND PELVIS WITHOUT CONTRAST  TECHNIQUE: Multidetector CT imaging of the abdomen and pelvis was performed following the standard protocol without IV contrast.  COMPARISON:  Renal ultrasound July 02, 2015 and CT abdomen and pelvis Mar 17, 2015  FINDINGS: LUNG BASES: Included view of the lung bases demonstrate RIGHT middle lobe atelectasis. The visualized heart and pericardium are unremarkable.  KIDNEYS/BLADDER: Kidneys are orthotopic, demonstrating normal size and morphology. Mild LEFT hydroureteronephrosis the level the mid ureter where a 4 mm calculus is present. No residual nephrolithiasis. Limited assessment for renal masses on this nonenhanced examination. The unopacified ureters are normal in course and caliber. Urinary bladder is partially distended and unremarkable.  SOLID ORGANS: Status post cholecystectomy. The liver is diffusely mildly hypodense compatible with steatosis. The pancreas pancreas and adrenal glands are unremarkable for this non-contrast examination.  GASTROINTESTINAL TRACT: The stomach, small and large bowel are normal in course and caliber without inflammatory changes, the sensitivity may be decreased by lack of enteric contrast. Normal appendix.  PERITONEUM/RETROPERITONEUM: Aortoiliac vessels are normal in course and caliber, trace calcific  atherosclerosis. No lymphadenopathy by CT size criteria. Mild prostatomegaly invading the base the urinary bladder. No intraperitoneal free fluid nor free air.  SOFT TISSUES/ OSSEOUS STRUCTURES:  Nonsuspicious.  IMPRESSION: 4 mm LEFT mid ureter calculus resulting in mild obstructive uropathy. No residual nephrolithiasis.   Electronically Signed   By: Elon Alas M.D.   On: 07/04/2015 06:56   I have personally reviewed and evaluated these images and lab results as part of my medical decision-making.   EKG Interpretation None      MDM   Final diagnoses:  Renal  colic on left side   Sean [name] presents with persistent flank pain, nausea, vomiting and generalized weakness.  Patient also has low-grade fever. He followed with urology several days ago however believes his symptoms have worsened.  Will give pain control, nausea medicine and obtain CT renal to further characterize patient's stone.  8:37 AM Urinalysis with evidence of urinary tract infection however patient now with leukocytosis of 14.2 and elevated creatinine 1.59. CT renal with 4 mm left mid ureter calculus resulting in mild obstructive uropathy. Discussed with Dr. Ishmael Holter who will evaluate in the ED.    9:14 AM Dr. Diona Fanti will admit at this time.  Pt reports increasing pain again.    BP 118/82 mmHg  Pulse 72  Temp(Src) 99.7 F (37.6 C) (Rectal)  Resp 18  Ht 5\' 9"  (1.753 m)  Wt 245 lb (111.131 kg)  BMI 36.16 kg/m2  SpO2 94%   Abigail Butts, PA-C 07/04/15 [place], MD 07/04/15 2127

## 2015-07-04 NOTE — ED Notes (Addendum)
Pt from home c/o left flank pain. He was seen for kidney stones via [place] on 8/18. Pt has followed up with urology. He reports nausea and vomiting. Denies urinary symptoms.

## 2015-07-04 NOTE — Transfer of Care (Signed)
Immediate Anesthesia Transfer of Care Note  Patient: Sean [name]  Procedure(s) Performed: Procedure(s): CYSTOSCOPY WITH RETROGRADE PYELOGRAM, URETEROSCOPY AND STENT PLACEMENT (Left)  Patient Location: PACU  Anesthesia Type:General  Level of Consciousness:  sedated, patient cooperative and responds to stimulation  Airway & Oxygen Therapy:Patient Spontanous Breathing and Patient connected to face mask oxgen  Post-op Assessment:  Report given to PACU RN and Post -op Vital signs reviewed and stable  Post vital signs:  Reviewed and stable  Last Vitals:  Filed Vitals:   07/04/15 1300  BP: 121/52  Pulse: 69  Temp: 36.6 C  Resp: 16    Complications: No apparent anesthesia complications

## 2015-07-04 NOTE — ED Notes (Signed)
MD Dahlstedt notified of pt requesting headache meds via text page.

## 2015-07-04 NOTE — Anesthesia Postprocedure Evaluation (Signed)
  Anesthesia Post-op Note  Patient: Sean [name]  Procedure(s) Performed: Procedure(s) (LRB): CYSTOSCOPY WITH RETROGRADE PYELOGRAM, URETEROSCOPY AND STENT PLACEMENT (Left)  Patient Location: PACU  Anesthesia Type: General  Level of Consciousness: awake and alert   Airway and Oxygen Therapy: Patient Spontanous Breathing  Post-op Pain: mild  Post-op Assessment: Post-op Vital signs reviewed, Patient's Cardiovascular Status Stable, Respiratory Function Stable, Patent Airway and No signs of Nausea or vomiting  Last Vitals:  Filed Vitals:   07/04/15 2000  BP: 157/79  Pulse: 81  Temp:   Resp: 18    Post-op Vital Signs: stable   Complications: No apparent anesthesia complications

## 2015-07-04 NOTE — Progress Notes (Signed)
Received pt from ED being evaluated for left flank pain for past three days with nausea and vomiting. Ct shows 4 mm partial obstruction left kidney stone. Pt NPO for surgery @1630 . Pt awake and alert, rates pain 2/10. No nausea or vomiting. Also complains of a  Headache.

## 2015-07-04 NOTE — Anesthesia Procedure Notes (Signed)
Procedure Name: Intubation Performed by: Gean Maidens Pre-anesthesia Checklist: Patient identified, Emergency Drugs available, Suction available, Patient being monitored and Timeout performed Patient Re-evaluated:Patient Re-evaluated prior to inductionOxygen Delivery Method: Circle system utilized Preoxygenation: Pre-oxygenation with 100% oxygen (RSI) Intubation Type: IV induction Laryngoscope Size: Mac and 4 Grade View: Grade I Tube type: Oral Tube size: 7.5 mm Number of attempts: 1 Airway Equipment and Method: Stylet Placement Confirmation: ETT inserted through vocal cords under direct vision,  positive ETCO2,  CO2 detector and breath sounds checked- equal and bilateral Secured at: 23 cm Tube secured with: Tape Dental Injury: Teeth and Oropharynx as per pre-operative assessment

## 2015-07-04 NOTE — H&P (Signed)
Urology History and Physical Exam  CC: Kidney stone  HPI: 44 year old male presents for the third time in 1 week for left flank pain. His initial presentation was last Thursday night with left flank pain, nausea and vomiting. Ultrasound of kidneys revealed no hydronephrosis but large bilateral renal stones. He presented shortly after that in my office with persistent pain. It was felt that his stone may be distal, it was obviously small and he was sent home on Flomax and hydrocodone. The patient is not tolerated home management and presents at this time with persistent pain, nausea and vomiting. He has had limited oral intake. He is admitted at this point for further management of the stone that is now 4 mm in size and in the left mid ureter.  PMH: Past Medical History  Diagnosis Date  . Cancer 2003    testicular  . Kidney stones     PSH: Past Surgical History  Procedure Laterality Date  . Cystoscopy w/ ureteral stent placement Right 03/25/2015    Procedure: CYSTOSCOPY, RETROGRADE PYELOGRAM  WITH RIGHT URETERAL STENT PLACEMENT;  Surgeon: [name], MD;  Location: [facility];  Service: Urology;  Laterality: Right;  . Surgery scrotal / testicular  2003    for testicular cancer    Allergies: Allergies  Allergen Reactions  . Bee Venom Anaphylaxis  . Shellfish Allergy Anaphylaxis  . Hydromorphone Hcl Nausea And Vomiting    DILAUDID    Medications:  (Not in a hospital admission)   Social History: Social History   Social History  . Marital Status: Married    Spouse Name: N/A  . Number of Children: N/A  . Years of Education: N/A   Occupational History  . Not on file.   Social History Main Topics  . Smoking status: Former Research scientist (life sciences)  . Smokeless tobacco: Not on file  . Alcohol Use: Yes  . Drug Use: No  . Sexual Activity: Not on file   Other Topics Concern  . Not on file   Social History Narrative    Family History: No family history on file.  Review of  Systems: Positive: Left flank pain, nausea, vomiting, decreased by mouth intake. Mild temperature to 99.8 Negative:   A further 10 point review of systems was negative except what is listed in the HPI.                  Physical Exam: @VITALS2 @ General: No acute distress.  Awake. Head:  Normocephalic.  Atraumatic. ENT:  EOMI.  Mucous membranes moist Neck:  Supple.  No lymphadenopathy. CV:  S1 present. S2 present. Regular rate. Pulmonary: Equal effort bilaterally.  Clear to auscultation bilaterally. Abdomen: Soft. Mild left lower quadrant and left CVA tenderness Skin:  Normal turgor.  No visible rash. Extremity: No gross deformity of bilateral upper extremities.  No gross deformity of lower extremities. Neurologic: Alert. Appropriate mood.    Studies:  Recent Labs     07/01/15  2308  07/04/15  0610  HGB  15.5  14.5  WBC  7.6  14.2*  PLT  238  211    Recent Labs     07/01/15  2308  07/04/15  0610  NA  140  135  K  3.7  4.4  CL  106  105  CO2  23  20*  BUN  10  17  CREATININE  1.13  1.59*  CALCIUM  9.2  8.8*  GFRNONAA  >60  51*  GFRAA  >60  75*    I reviewed the patient's laboratories and CT scan No results for input(s): INR, APTT in the last 72 hours.  Invalid input(s): PT   Invalid input(s): ABG    Assessment:  Persistent fairly symptomatic left midureteral stone.  Plan: At this point, with the patient's persistent pain and limited oral intake, we will proceed with cystoscopy, left retrograde ureteropyelogram and ureteroscopy. He will be admitted for observation and pain management.

## 2015-07-05 ENCOUNTER — Encounter (HOSPITAL_COMMUNITY): Payer: Self-pay | Admitting: Urology

## 2015-07-05 MED ORDER — OXYBUTYNIN CHLORIDE 5 MG PO TABS: 5.0000 mg | ORAL_TABLET | Freq: Three times a day (TID) | ORAL | Status: DC | PRN

## 2015-07-05 MED ORDER — SULFAMETHOXAZOLE-TRIMETHOPRIM 800-160 MG PO TABS: 1.0000 | ORAL_TABLET | Freq: Two times a day (BID) | ORAL | Status: DC

## 2015-07-05 NOTE — Discharge Instructions (Signed)
1. You may see some blood in the urine and may have some burning with urination for 48-72 hours. You also may notice that you have to urinate more frequently or urgently after your procedure which is normal.  2. You should call should you develop an inability urinate, fever > 101, persistent nausea and vomiting that prevents you from eating or drinking to stay hydrated.  3. If you have a stent, you will likely urinate more frequently and urgently until the stent is removed and you may experience some discomfort/pain in the lower abdomen and flank especially when urinating. You may take pain medication prescribed to you if needed for pain. You may also intermittently have blood in the urine until the stent is removed. Pull string to remove stent on Monday 8/29 4. If you have a catheter, you will be taught how to take care of the catheter by the nursing staff prior to discharge from the hospital.  You may periodically feel a strong urge to void with the catheter in place.  This is a bladder spasm and most often can occur when having a bowel movement or moving around. It is typically self-limited and usually will stop after a few minutes.  You may use some Vaseline or Neosporin around the tip of the catheter to reduce friction at the tip of the penis. You may also see some blood in the urine.  A very small amount of blood can make the urine look quite red.  As long as the catheter is draining well, there usually is not a problem.  However, if the catheter is not draining well and is bloody, you should call the office [phone]) to notify us.

## 2015-07-05 NOTE — Discharge Summary (Signed)
Patient ID: Sean [name] MRN: [id number] DOB/AGE: [birth year] 44 y.o.  Admit date: 07/04/2015 Discharge date: 07/05/2015  Primary Care Physician:  Glo Herring., MD  Discharge Diagnoses:   Present on Admission:  . Ureteral calculus, left  Consults:  None     Discharge Medications:   Medication List    STOP taking these medications        oxyCODONE-acetaminophen 5-325 MG per tablet  Commonly known as:  PERCOCET/ROXICET      TAKE these medications        HYDROcodone-acetaminophen 5-325 MG per tablet  Commonly known as:  NORCO/VICODIN  Take 1 tablet by mouth every 6 (six) hours as needed for moderate pain.     metoCLOPramide 10 MG tablet  Commonly known as:  REGLAN  Take 1 tablet (10 mg total) by mouth every 6 (six) hours.     oxybutynin 5 MG tablet  Commonly known as:  DITROPAN  Take 1 tablet (5 mg total) by mouth every 8 (eight) hours as needed for bladder spasms.     sulfamethoxazole-trimethoprim 800-160 MG per tablet  Commonly known as:  BACTRIM DS,SEPTRA DS  Take 1 tablet by mouth 2 (two) times daily.         Significant Diagnostic Studies:  Ct Renal Stone Study  07/04/2015   CLINICAL DATA:  LEFT flank pain, history of kidney stones and cancer. Nausea and vomiting.  EXAM: CT ABDOMEN AND PELVIS WITHOUT CONTRAST  TECHNIQUE: Multidetector CT imaging of the abdomen and pelvis was performed following the standard protocol without IV contrast.  COMPARISON:  Renal ultrasound July 02, 2015 and CT abdomen and pelvis Mar 17, 2015  FINDINGS: LUNG BASES: Included view of the lung bases demonstrate RIGHT middle lobe atelectasis. The visualized heart and pericardium are unremarkable.  KIDNEYS/BLADDER: Kidneys are orthotopic, demonstrating normal size and morphology. Mild LEFT hydroureteronephrosis the level the mid ureter where a 4 mm calculus is present. No residual nephrolithiasis. Limited assessment for renal masses on this nonenhanced examination. The unopacified  ureters are normal in course and caliber. Urinary bladder is partially distended and unremarkable.  SOLID ORGANS: Status post cholecystectomy. The liver is diffusely mildly hypodense compatible with steatosis. The pancreas pancreas and adrenal glands are unremarkable for this non-contrast examination.  GASTROINTESTINAL TRACT: The stomach, small and large bowel are normal in course and caliber without inflammatory changes, the sensitivity may be decreased by lack of enteric contrast. Normal appendix.  PERITONEUM/RETROPERITONEUM: Aortoiliac vessels are normal in course and caliber, trace calcific atherosclerosis. No lymphadenopathy by CT size criteria. Mild prostatomegaly invading the base the urinary bladder. No intraperitoneal free fluid nor free air.  SOFT TISSUES/ OSSEOUS STRUCTURES:  Nonsuspicious.  IMPRESSION: 4 mm LEFT mid ureter calculus resulting in mild obstructive uropathy. No residual nephrolithiasis.   Electronically Signed   By: Elon Alas M.D.   On: 07/04/2015 06:56    Brief H and P: For complete details please refer to admission H and P, but in brief the patient was admitted urgently for ureteroscopic management of a symptomatic left ureteral stone.  Hospital Course:  Active Problems:   Ureteral calculus, left   Day of Discharge BP 105/56 mmHg  Pulse 64  Temp(Src) 98.2 F (36.8 C) (Oral)  Resp 18  Ht 5\' 9"  (1.753 m)  Wt 111.131 kg (245 lb)  BMI 36.16 kg/m2  SpO2 97%  No results found for this or any previous visit (from the past 24 hour(s)).  Physical Exam: General: Alert and awake oriented x3 not in  any acute distress. HEENT: anicteric sclera, pupils reactive to light and accommodation CVS: S1-S2 clear no murmur rubs or gallops Chest: clear to auscultation bilaterally, no wheezing rales or rhonchi Abdomen: soft nontender, nondistended, normal bowel sounds, no organomegaly Extremities: no cyanosis, clubbing or edema noted bilaterally Neuro: Cranial nerves II-XII  intact, no focal neurological deficits  Disposition:  home  Diet:  regular  Activity:  No restrictions   Disposition and Follow-up:    Follow-up 18/31  TESTS THAT NEED FOLLOW-UP  none  DISCHARGE FOLLOW-UP     Follow-up Information    Follow up with Jorja Loa, MD.   Specialty:  Urology   Why:  we'll call you   Contact information:   [place] [address] [phone]       Time spent on Discharge:  15 minutes  Signed: Jorja Loa 07/05/2015, 11:20 AM

## 2015-07-05 NOTE — Care Management Important Message (Signed)
Important Message  Patient Details  Name: Sean [name] MRN: [id number] Date of Birth: 1970-11-27   Medicare Important Message Given:  Yes-second notification given    Dessa Phi, RN 07/05/2015, 10:58 AM

## 2015-07-05 NOTE — Care Management Note (Signed)
Case Management Note  Patient Details  Name: Sean [name] MRN: [id number] Date of Birth: [birth year]  Subjective/Objective:   44 y/o m admitted w/l ureteral calculus. From home.                Action/Plan:d/c home no needs or orders.   Expected Discharge Date:                 Expected Discharge Plan:  Home/Self Care  In-House Referral:     Discharge planning Services  CM Consult  Post Acute Care Choice:    Choice offered to:     DME Arranged:    DME Agency:     HH Arranged:    [place] Agency:     Status of Service:     Medicare Important Message Given:    Date Medicare IM Given:    Medicare IM give by:    Date Additional Medicare IM Given:    Additional Medicare Important Message give by:     If discussed at [place] of Stay Meetings, dates discussed:    Additional Comments:  Dessa Phi, RN 07/05/2015, 10:57 AM

## 2015-07-13 ENCOUNTER — Ambulatory Visit (INDEPENDENT_AMBULATORY_CARE_PROVIDER_SITE_OTHER): Payer: BLUE CROSS/BLUE SHIELD | Admitting: Urology

## 2015-07-13 DIAGNOSIS — N201 Calculus of ureter: Secondary | ICD-10-CM | POA: Diagnosis not present

## 2015-08-10 ENCOUNTER — Ambulatory Visit (INDEPENDENT_AMBULATORY_CARE_PROVIDER_SITE_OTHER): Payer: BLUE CROSS/BLUE SHIELD | Admitting: Urology

## 2015-08-10 DIAGNOSIS — N201 Calculus of ureter: Secondary | ICD-10-CM | POA: Diagnosis not present

## 2016-04-30 ENCOUNTER — Emergency Department (HOSPITAL_COMMUNITY)
Admission: EM | Admit: 2016-04-30 | Discharge: 2016-04-30 | Disposition: A | Discharge status: Home / Self Care | Payer: BLUE CROSS/BLUE SHIELD | Attending: Emergency Medicine | Admitting: Emergency Medicine

## 2016-04-30 ENCOUNTER — Encounter (HOSPITAL_COMMUNITY): Payer: Self-pay | Admitting: Emergency Medicine

## 2016-04-30 DIAGNOSIS — M7989 Other specified soft tissue disorders: Secondary | ICD-10-CM | POA: Diagnosis not present

## 2016-04-30 DIAGNOSIS — R600 Localized edema: Secondary | ICD-10-CM | POA: Diagnosis not present

## 2016-04-30 DIAGNOSIS — Z87891 Personal history of nicotine dependence: Secondary | ICD-10-CM | POA: Insufficient documentation

## 2016-04-30 DIAGNOSIS — Z79899 Other long term (current) drug therapy: Secondary | ICD-10-CM | POA: Insufficient documentation

## 2016-04-30 DIAGNOSIS — Z8547 Personal history of malignant neoplasm of testis: Secondary | ICD-10-CM | POA: Diagnosis not present

## 2016-04-30 LAB — URINALYSIS, DIPSTICK ONLY
BILIRUBIN URINE: NEGATIVE
Glucose, UA: NEGATIVE mg/dL
Hgb urine dipstick: NEGATIVE
KETONES UR: NEGATIVE mg/dL
LEUKOCYTES UA: NEGATIVE
NITRITE: NEGATIVE
Protein, ur: NEGATIVE mg/dL
Specific Gravity, Urine: 1.033 — ABNORMAL HIGH (ref 1.005–1.030)
pH: 6 (ref 5.0–8.0)

## 2016-04-30 LAB — COMPREHENSIVE METABOLIC PANEL
ALBUMIN: 3.5 g/dL (ref 3.5–5.0)
ALK PHOS: 57 U/L (ref 38–126)
ALT: 38 U/L (ref 17–63)
AST: 24 U/L (ref 15–41)
Anion gap: 9 (ref 5–15)
BILIRUBIN TOTAL: 0.6 mg/dL (ref 0.3–1.2)
BUN: 14 mg/dL (ref 6–20)
CALCIUM: 8.7 mg/dL — AB (ref 8.9–10.3)
CO2: 24 mmol/L (ref 22–32)
Chloride: 107 mmol/L (ref 101–111)
Creatinine, Ser: 1 mg/dL (ref 0.61–1.24)
GFR calc Af Amer: >60 mL/min (ref >60)
GLUCOSE: 106 mg/dL — AB (ref 65–99)
Potassium: 3.9 mmol/L (ref 3.5–5.1)
Sodium: 140 mmol/L (ref 135–145)
TOTAL PROTEIN: 6.3 g/dL — AB (ref 6.5–8.1)

## 2016-04-30 LAB — CBC
HCT: 44.5 % (ref 39.0–52.0)
HEMOGLOBIN: 14.6 g/dL (ref 13.0–17.0)
MCH: 28.4 pg (ref 26.0–34.0)
MCHC: 32.8 g/dL (ref 30.0–36.0)
MCV: 86.6 fL (ref 78.0–100.0)
Platelets: 214 10*3/uL (ref 150–400)
RBC: 5.14 MIL/uL (ref 4.22–5.81)
RDW: 13.1 % (ref 11.5–15.5)
WBC: 9.6 10*3/uL (ref 4.0–10.5)

## 2016-04-30 MED ORDER — DIPHENHYDRAMINE HCL 25 MG PO CAPS: 25.0000 mg | ORAL_CAPSULE | Freq: Four times a day (QID) | ORAL | Status: DC | PRN

## 2016-04-30 MED ORDER — TETANUS-DIPHTH-ACELL PERTUSSIS 5-2.5-18.5 LF-MCG/0.5 IM SUSP: 0.5000 mL | Freq: Once | INTRAMUSCULAR | Status: DC

## 2016-04-30 MED ORDER — DIPHENHYDRAMINE HCL 25 MG PO CAPS
50.0000 mg | ORAL_CAPSULE | Freq: Once | ORAL | Status: AC
  Administered 2016-04-30: 50 mg via ORAL
  Filled 2016-04-30: qty 2

## 2016-04-30 MED ORDER — CEFAZOLIN SODIUM-DEXTROSE 2-4 GM/100ML-% IV SOLN
2.0000 g | Freq: Once | INTRAVENOUS | Status: DC
Start: 2016-04-30 — End: 2016-04-30

## 2016-04-30 MED ORDER — PREDNISONE 10 MG PO TABS: 60.0000 mg | ORAL_TABLET | Freq: Every day | ORAL | Status: DC

## 2016-04-30 MED ORDER — PREDNISONE 20 MG PO TABS
60.0000 mg | ORAL_TABLET | Freq: Once | ORAL | Status: AC
  Administered 2016-04-30: 60 mg via ORAL
  Filled 2016-04-30: qty 3

## 2016-04-30 NOTE — Discharge Instructions (Signed)
All the ER results are normal. The list of conditions that can cause swelling in both hands is extensive, and will need full evaluation by an outpatient doctor, and possible even consultation of Rheumatologist.  We therefore ask you to take the meds prescribed, but seeing a primary doctor as soon as possible.

## 2016-04-30 NOTE — ED Notes (Signed)
Pt arrives by POV with c/o bilateral hand edema that started yesterday evening. Denies hx of edema, heart problems, or high blood pressure. Denies diet change. States that work gloves got wet on Friday, causing his hands to itch. States that he has been wringing his hands to relieve the itch.

## 2016-04-30 NOTE — ED Provider Notes (Signed)
CSN: [id number]     Arrival date & time 04/30/16  [id number] History   First MD Initiated Contact with Patient 04/30/16 [phone]     Chief Complaint  Patient presents with  . Arm Swelling     (Consider location/radiation/quality/duration/timing/severity/associated sxs/prior Treatment) HPI Comments: Pt comes in with cc of hand swelling. Pt reports that on Wed he got his gloved hand wet. He started having itching in the hand at that time, but there was no swelling. Yday evening however, he started having swelling and stretching like pain. Pt still has some itching. Pt denies any hx of similar symptoms in the past. Pt has no hx of liver dz, CHF. Hx of testicular cancer, he is cancer free currently.   ROS 10 Systems reviewed and are negative for acute change except as noted in the HPI.     The history is provided by the patient.    Past Medical History  Diagnosis Date  . Cancer [facility]) 2003    testicular  . Kidney stones    Past Surgical History  Procedure Laterality Date  . Cystoscopy w/ ureteral stent placement Right 03/25/2015    Procedure: CYSTOSCOPY, RETROGRADE PYELOGRAM  WITH RIGHT URETERAL STENT PLACEMENT;  Surgeon: [name], MD;  Location: [facility];  Service: Urology;  Laterality: Right;  . Surgery scrotal / testicular  2003    for testicular cancer  . Cystoscopy with retrograde pyelogram, ureteroscopy and stent placement Left 07/04/2015    Procedure: [place], URETEROSCOPY AND STENT PLACEMENT;  Surgeon: [name], MD;  Location: [facility];  Service: Urology;  Laterality: Left;  With Holmium Laser   History reviewed. No pertinent family history. Social History  Substance Use Topics  . Smoking status: Former Research scientist (life sciences)  . Smokeless tobacco: None  . Alcohol Use: No    Review of Systems    Allergies  Bee venom; Shellfish allergy; and Hydromorphone hcl  Home Medications   Prior to Admission medications   Medication Sig Start Date End Date  Taking? Authorizing Provider  diphenhydrAMINE (BENADRYL) 25 mg capsule Take 1 capsule (25 mg total) by mouth every 6 (six) hours as needed for itching. 04/30/16   Varney Biles, MD  metoCLOPramide (REGLAN) 10 MG tablet Take 1 tablet (10 mg total) by mouth every 6 (six) hours. Patient not taking: Reported on 04/30/2016 07/02/15   Comer Locket, PA-C  oxybutynin (DITROPAN) 5 MG tablet Take 1 tablet (5 mg total) by mouth every 8 (eight) hours as needed for bladder spasms. Patient not taking: Reported on 04/30/2016 07/05/15   [name], MD  predniSONE (DELTASONE) 10 MG tablet Take 6 tablets (60 mg total) by mouth daily. 04/30/16   Varney Biles, MD  sulfamethoxazole-trimethoprim (BACTRIM DS,SEPTRA DS) 800-160 MG per tablet Take 1 tablet by mouth 2 (two) times daily. Patient not taking: Reported on 04/30/2016 07/05/15   [name], MD   BP 120/68 mmHg  Pulse 75  Temp(Src) 97.6 F (36.4 C) (Oral)  Resp 14  SpO2 96% Physical Exam  Constitutional: He is oriented to person, place, and time. He appears well-developed.  HENT:  Head: Atraumatic.  Neck: Neck supple.  Cardiovascular: Normal rate.   Pulmonary/Chest: Effort normal.  Musculoskeletal: He exhibits edema.  Bilateral hand swelling. There is edema, with blanching. Doesn't appear to be pitting. 2+ radial pulse. Cap refill normal. No tightness in the skin. Able to make a fist.   Neurological: He is alert and oriented to person, place, and time.  Skin: Skin is warm.  Nursing note and vitals reviewed.   ED Course  Procedures (including critical care time) Labs Review Labs Reviewed  COMPREHENSIVE METABOLIC PANEL - Abnormal; Notable for the following:    Glucose, Bld 106 (*)    Calcium 8.7 (*)    Total Protein 6.3 (*)    All other components within normal limits  URINALYSIS, DIPSTICK ONLY - Abnormal; Notable for the following:    Specific Gravity, Urine 1.033 (*)    All other components within normal limits  CBC     Imaging Review No results found. I have personally reviewed and evaluated these images and lab results as part of my medical decision-making.   EKG Interpretation None      MDM   Final diagnoses:  Bilateral hand swelling    Pt comes in with bilateral hand swelling. There is no leg swelling. Pt has no heart, liver dz. No hx of renal issues.  - we will check kidney function, albumin, UA - to ensure there is no nephrotic syndrome or low protein status.  Otherwise, the main concern is that this likely is some sort of hypersensitivity or more likely a rheumatologic problem. Pt will be advised to pcp if workup neg. Will d/c with prednisone and benadryl.    Varney Biles, MD 04/30/16 [phone]

## 2016-05-01 DIAGNOSIS — R2233 Localized swelling, mass and lump, upper limb, bilateral: Secondary | ICD-10-CM | POA: Diagnosis not present

## 2016-05-01 DIAGNOSIS — Z6837 Body mass index (BMI) 37.0-37.9, adult: Secondary | ICD-10-CM | POA: Diagnosis not present

## 2016-05-01 DIAGNOSIS — T7840XD Allergy, unspecified, subsequent encounter: Secondary | ICD-10-CM | POA: Diagnosis not present

## 2016-05-01 DIAGNOSIS — Z1389 Encounter for screening for other disorder: Secondary | ICD-10-CM | POA: Diagnosis not present

## 2016-05-03 DIAGNOSIS — Z1389 Encounter for screening for other disorder: Secondary | ICD-10-CM | POA: Diagnosis not present

## 2016-05-03 DIAGNOSIS — Z9189 Other specified personal risk factors, not elsewhere classified: Secondary | ICD-10-CM | POA: Diagnosis not present

## 2016-05-03 DIAGNOSIS — Z6837 Body mass index (BMI) 37.0-37.9, adult: Secondary | ICD-10-CM | POA: Diagnosis not present

## 2016-05-09 DIAGNOSIS — G4733 Obstructive sleep apnea (adult) (pediatric): Secondary | ICD-10-CM | POA: Diagnosis not present

## 2016-05-09 DIAGNOSIS — J019 Acute sinusitis, unspecified: Secondary | ICD-10-CM | POA: Diagnosis not present

## 2016-05-09 DIAGNOSIS — E6609 Other obesity due to excess calories: Secondary | ICD-10-CM | POA: Diagnosis not present

## 2016-05-09 DIAGNOSIS — M545 Low back pain: Secondary | ICD-10-CM | POA: Diagnosis not present

## 2016-05-17 ENCOUNTER — Other Ambulatory Visit (HOSPITAL_COMMUNITY): Payer: Self-pay | Admitting: Respiratory Therapy

## 2016-05-17 DIAGNOSIS — G4733 Obstructive sleep apnea (adult) (pediatric): Secondary | ICD-10-CM

## 2016-05-25 ENCOUNTER — Ambulatory Visit: Payer: BLUE CROSS/BLUE SHIELD | Attending: Neurology | Admitting: Neurology

## 2016-05-25 DIAGNOSIS — R0683 Snoring: Secondary | ICD-10-CM | POA: Insufficient documentation

## 2016-05-25 DIAGNOSIS — Z79899 Other long term (current) drug therapy: Secondary | ICD-10-CM | POA: Insufficient documentation

## 2016-05-25 DIAGNOSIS — G4733 Obstructive sleep apnea (adult) (pediatric): Secondary | ICD-10-CM

## 2016-05-27 NOTE — Procedures (Signed)
  Dana A. Merlene Laughter, MD     [email or web address]             NOCTURNAL POLYSOMNOGRAPHY   LOCATION: [facility]  Patient Name: Aithen, Bein Study Date: 05/25/2016 Gender: Male D.O.B: 12-08-1970 Age (years): 48 Referring Provider: Not Available Height (inches): 69 Interpreting Physician: Phillips Odor MD, ABSM Weight (lbs): 251 RPSGT: Peak, Robert BMI: 37 MRN: [id number] Neck Size: 18.50 CLINICAL INFORMATION Sleep Study Type: NPSG Indication for sleep study: N/A Epworth Sleepiness Score: 0 SLEEP STUDY TECHNIQUE As per the AASM Manual for the Scoring of Sleep and Associated Events v2.3 (April 2016) with a hypopnea requiring 4% desaturations. The channels recorded and monitored were frontal, central and occipital EEG, electrooculogram (EOG), submentalis EMG (chin), nasal and oral airflow, thoracic and abdominal wall motion, anterior tibialis EMG, snore microphone, electrocardiogram, and pulse oximetry. MEDICATIONS Patient's medications include: N/A. Medications self-administered by patient during sleep study : No sleep medicine administered.  Current outpatient prescriptions:  .  diphenhydrAMINE (BENADRYL) 25 mg capsule, Take 1 capsule (25 mg total) by mouth every 6 (six) hours as needed for itching., Disp: 30 capsule, Rfl: 0 .  metoCLOPramide (REGLAN) 10 MG tablet, Take 1 tablet (10 mg total) by mouth every 6 (six) hours. (Patient not taking: Reported on 04/30/2016), Disp: 30 tablet, Rfl: 0 .  oxybutynin (DITROPAN) 5 MG tablet, Take 1 tablet (5 mg total) by mouth every 8 (eight) hours as needed for bladder spasms. (Patient not taking: Reported on 04/30/2016), Disp: 30 tablet, Rfl: 0 .  predniSONE (DELTASONE) 10 MG tablet, Take 6 tablets (60 mg total) by mouth daily., Disp: 30 tablet, Rfl: 0 .  sulfamethoxazole-trimethoprim (BACTRIM DS,SEPTRA DS) 800-160 MG per tablet, Take 1 tablet by mouth 2 (two) times daily. (Patient not taking: Reported on 04/30/2016), Disp:  6 tablet, Rfl: 0  SLEEP ARCHITECTURE The study was initiated at 9:42:48 PM and ended at 4:31:51 AM. Sleep onset time was 35.0 minutes and the sleep efficiency was 55.5%. The total sleep time was 227.0 minutes. Stage REM latency was 127.0 minutes. The patient spent 15.20% of the night in stage N1 sleep, 64.54% in stage N2 sleep, 1.32% in stage N3 and 18.94% in REM. Alpha intrusion was absent. Supine sleep was 0.53%. RESPIRATORY PARAMETERS The overall apnea/hypopnea index (AHI) was 6.6 per hour. There were 15 total apneas, including 0 obstructive, 15 central and 0 mixed apneas. There were 10 hypopneas and 26 RERAs. The AHI during Stage REM sleep was 7.0 per hour. AHI while supine was 100.1 per hour. The mean oxygen saturation was 93.45%. The minimum SpO2 during sleep was 89.00%. Soft snoring was noted during this study. CARDIAC DATA The 2 lead EKG demonstrated sinus rhythm. The mean heart rate was 56.12 beats per minute. Other EKG findings include: None. LEG MOVEMENT DATA The total PLMS were 102 with a resulting PLMS index of 26.96. Associated arousal with leg movement index was 0.5.   IMPRESSIONS - Mild obstructive sleep apnea. - No significant central sleep apnea occurred during this study. 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  - Reduced sleep efficiency.    Delano Metz, MD Diplomate, American Board of Sleep Medicine.

## 2016-06-05 DIAGNOSIS — J329 Chronic sinusitis, unspecified: Secondary | ICD-10-CM | POA: Diagnosis not present

## 2016-06-05 DIAGNOSIS — E6609 Other obesity due to excess calories: Secondary | ICD-10-CM | POA: Diagnosis not present

## 2016-06-05 DIAGNOSIS — M545 Low back pain: Secondary | ICD-10-CM | POA: Diagnosis not present

## 2016-06-05 DIAGNOSIS — G4733 Obstructive sleep apnea (adult) (pediatric): Secondary | ICD-10-CM | POA: Diagnosis not present

## 2016-07-11 ENCOUNTER — Ambulatory Visit (HOSPITAL_COMMUNITY)
Admission: RE | Admit: 2016-07-11 | Discharge: 2016-07-11 | Disposition: A | Discharge status: Home / Self Care | Payer: BLUE CROSS/BLUE SHIELD | Source: Ambulatory Visit | Attending: Urology | Admitting: Urology

## 2016-07-11 ENCOUNTER — Other Ambulatory Visit: Payer: Self-pay | Admitting: Urology

## 2016-07-11 DIAGNOSIS — N201 Calculus of ureter: Secondary | ICD-10-CM

## 2016-07-11 DIAGNOSIS — Z87442 Personal history of urinary calculi: Secondary | ICD-10-CM | POA: Insufficient documentation

## 2016-07-18 ENCOUNTER — Ambulatory Visit (INDEPENDENT_AMBULATORY_CARE_PROVIDER_SITE_OTHER): Payer: BLUE CROSS/BLUE SHIELD | Admitting: Urology

## 2016-07-18 DIAGNOSIS — N200 Calculus of kidney: Secondary | ICD-10-CM

## 2016-11-02 DIAGNOSIS — Z6837 Body mass index (BMI) 37.0-37.9, adult: Secondary | ICD-10-CM | POA: Diagnosis not present

## 2016-11-02 DIAGNOSIS — J069 Acute upper respiratory infection, unspecified: Secondary | ICD-10-CM | POA: Diagnosis not present

## 2016-11-02 DIAGNOSIS — Z1389 Encounter for screening for other disorder: Secondary | ICD-10-CM | POA: Diagnosis not present

## 2017-07-03 DIAGNOSIS — Z13220 Encounter for screening for lipoid disorders: Secondary | ICD-10-CM | POA: Diagnosis not present

## 2017-07-03 DIAGNOSIS — M1991 Primary osteoarthritis, unspecified site: Secondary | ICD-10-CM | POA: Diagnosis not present

## 2017-07-03 DIAGNOSIS — Z6837 Body mass index (BMI) 37.0-37.9, adult: Secondary | ICD-10-CM | POA: Diagnosis not present

## 2017-07-03 DIAGNOSIS — C6210 Malignant neoplasm of unspecified descended testis: Secondary | ICD-10-CM | POA: Diagnosis not present

## 2017-07-03 DIAGNOSIS — Z1389 Encounter for screening for other disorder: Secondary | ICD-10-CM | POA: Diagnosis not present

## 2017-07-03 DIAGNOSIS — E8881 Metabolic syndrome: Secondary | ICD-10-CM | POA: Diagnosis not present

## 2017-07-03 DIAGNOSIS — R946 Abnormal results of thyroid function studies: Secondary | ICD-10-CM | POA: Diagnosis not present

## 2017-07-03 DIAGNOSIS — E669 Obesity, unspecified: Secondary | ICD-10-CM | POA: Diagnosis not present

## 2017-07-03 DIAGNOSIS — R6889 Other general symptoms and signs: Secondary | ICD-10-CM | POA: Diagnosis not present

## 2017-08-02 ENCOUNTER — Other Ambulatory Visit: Payer: Self-pay | Admitting: Urology

## 2017-08-02 ENCOUNTER — Ambulatory Visit (HOSPITAL_COMMUNITY)
Admission: RE | Admit: 2017-08-02 | Discharge: 2017-08-02 | Disposition: A | Discharge status: Home / Self Care | Payer: BLUE CROSS/BLUE SHIELD | Source: Ambulatory Visit | Attending: Urology | Admitting: Urology

## 2017-08-02 DIAGNOSIS — G5601 Carpal tunnel syndrome, right upper limb: Secondary | ICD-10-CM | POA: Diagnosis not present

## 2017-08-02 DIAGNOSIS — N200 Calculus of kidney: Secondary | ICD-10-CM | POA: Diagnosis not present

## 2017-08-02 DIAGNOSIS — G5602 Carpal tunnel syndrome, left upper limb: Secondary | ICD-10-CM | POA: Diagnosis not present

## 2017-08-02 DIAGNOSIS — M1811 Unilateral primary osteoarthritis of first carpometacarpal joint, right hand: Secondary | ICD-10-CM | POA: Diagnosis not present

## 2017-08-02 DIAGNOSIS — M1812 Unilateral primary osteoarthritis of first carpometacarpal joint, left hand: Secondary | ICD-10-CM | POA: Diagnosis not present

## 2017-08-13 DIAGNOSIS — M1811 Unilateral primary osteoarthritis of first carpometacarpal joint, right hand: Secondary | ICD-10-CM | POA: Diagnosis not present

## 2017-08-13 DIAGNOSIS — M1812 Unilateral primary osteoarthritis of first carpometacarpal joint, left hand: Secondary | ICD-10-CM | POA: Diagnosis not present

## 2017-08-13 DIAGNOSIS — G5603 Carpal tunnel syndrome, bilateral upper limbs: Secondary | ICD-10-CM | POA: Diagnosis not present

## 2017-08-14 ENCOUNTER — Ambulatory Visit (INDEPENDENT_AMBULATORY_CARE_PROVIDER_SITE_OTHER): Payer: BLUE CROSS/BLUE SHIELD | Admitting: Urology

## 2017-08-14 DIAGNOSIS — N200 Calculus of kidney: Secondary | ICD-10-CM | POA: Diagnosis not present

## 2017-08-23 DIAGNOSIS — M1811 Unilateral primary osteoarthritis of first carpometacarpal joint, right hand: Secondary | ICD-10-CM | POA: Diagnosis not present

## 2017-08-23 DIAGNOSIS — G5601 Carpal tunnel syndrome, right upper limb: Secondary | ICD-10-CM | POA: Diagnosis not present

## 2017-08-23 DIAGNOSIS — G5602 Carpal tunnel syndrome, left upper limb: Secondary | ICD-10-CM | POA: Diagnosis not present

## 2017-08-23 DIAGNOSIS — M1812 Unilateral primary osteoarthritis of first carpometacarpal joint, left hand: Secondary | ICD-10-CM | POA: Diagnosis not present

## 2017-09-18 DIAGNOSIS — M1811 Unilateral primary osteoarthritis of first carpometacarpal joint, right hand: Secondary | ICD-10-CM | POA: Diagnosis not present

## 2017-09-18 DIAGNOSIS — M1812 Unilateral primary osteoarthritis of first carpometacarpal joint, left hand: Secondary | ICD-10-CM | POA: Diagnosis not present

## 2017-09-18 DIAGNOSIS — G5601 Carpal tunnel syndrome, right upper limb: Secondary | ICD-10-CM | POA: Diagnosis not present

## 2017-09-18 DIAGNOSIS — G5602 Carpal tunnel syndrome, left upper limb: Secondary | ICD-10-CM | POA: Diagnosis not present

## 2017-10-30 DIAGNOSIS — Z6837 Body mass index (BMI) 37.0-37.9, adult: Secondary | ICD-10-CM | POA: Diagnosis not present

## 2017-10-30 DIAGNOSIS — Z1389 Encounter for screening for other disorder: Secondary | ICD-10-CM | POA: Diagnosis not present

## 2017-10-30 DIAGNOSIS — J019 Acute sinusitis, unspecified: Secondary | ICD-10-CM | POA: Diagnosis not present

## 2017-10-30 DIAGNOSIS — J302 Other seasonal allergic rhinitis: Secondary | ICD-10-CM | POA: Diagnosis not present

## 2018-06-04 DIAGNOSIS — D2262 Melanocytic nevi of left upper limb, including shoulder: Secondary | ICD-10-CM | POA: Diagnosis not present

## 2018-06-04 DIAGNOSIS — L821 Other seborrheic keratosis: Secondary | ICD-10-CM | POA: Diagnosis not present

## 2018-06-04 DIAGNOSIS — L918 Other hypertrophic disorders of the skin: Secondary | ICD-10-CM | POA: Diagnosis not present

## 2018-06-04 DIAGNOSIS — D2261 Melanocytic nevi of right upper limb, including shoulder: Secondary | ICD-10-CM | POA: Diagnosis not present

## 2018-06-04 DIAGNOSIS — L820 Inflamed seborrheic keratosis: Secondary | ICD-10-CM | POA: Diagnosis not present

## 2018-06-04 DIAGNOSIS — D2372 Other benign neoplasm of skin of left lower limb, including hip: Secondary | ICD-10-CM | POA: Diagnosis not present

## 2018-08-16 DIAGNOSIS — G5602 Carpal tunnel syndrome, left upper limb: Secondary | ICD-10-CM | POA: Diagnosis not present

## 2018-08-16 DIAGNOSIS — G5601 Carpal tunnel syndrome, right upper limb: Secondary | ICD-10-CM | POA: Diagnosis not present

## 2018-08-16 DIAGNOSIS — M13842 Other specified arthritis, left hand: Secondary | ICD-10-CM | POA: Diagnosis not present

## 2018-08-16 DIAGNOSIS — M13841 Other specified arthritis, right hand: Secondary | ICD-10-CM | POA: Diagnosis not present

## 2018-08-16 DIAGNOSIS — G5600 Carpal tunnel syndrome, unspecified upper limb: Secondary | ICD-10-CM | POA: Insufficient documentation

## 2018-09-02 ENCOUNTER — Emergency Department (HOSPITAL_COMMUNITY)
Admission: EM | Admit: 2018-09-02 | Discharge: 2018-09-02 | Disposition: A | Discharge status: Home / Self Care | Payer: BLUE CROSS/BLUE SHIELD | Attending: Emergency Medicine | Admitting: Emergency Medicine

## 2018-09-02 ENCOUNTER — Encounter (HOSPITAL_COMMUNITY): Payer: Self-pay | Admitting: Emergency Medicine

## 2018-09-02 ENCOUNTER — Other Ambulatory Visit: Payer: Self-pay

## 2018-09-02 ENCOUNTER — Emergency Department (HOSPITAL_BASED_OUTPATIENT_CLINIC_OR_DEPARTMENT_OTHER)
Admit: 2018-09-02 | Discharge: 2018-09-02 | Disposition: A | Discharge status: Home / Self Care | Payer: BLUE CROSS/BLUE SHIELD | Attending: Emergency Medicine | Admitting: Emergency Medicine

## 2018-09-02 DIAGNOSIS — R52 Pain, unspecified: Secondary | ICD-10-CM | POA: Diagnosis not present

## 2018-09-02 DIAGNOSIS — M79605 Pain in left leg: Secondary | ICD-10-CM | POA: Diagnosis not present

## 2018-09-02 DIAGNOSIS — M79604 Pain in right leg: Secondary | ICD-10-CM | POA: Diagnosis not present

## 2018-09-02 NOTE — Progress Notes (Signed)
Bilateral lower extremity venous duplex has been completed. Negative for DVT. Results were given to Dr. Wilson Singer.  09/02/18 2:27 PM Sean [name] RVT

## 2018-09-02 NOTE — ED Provider Notes (Signed)
[place] DEPT Provider Note   CSN: [id number] Arrival date & time: 09/02/18  1127     History   Chief Complaint Chief Complaint  Patient presents with  . Leg Pain    HPI Sean [name] is a 47 y.o. male.  HPI   47 year old male with leg pain.  First began on Thursday having pain in his posterior left leg in the thigh.  Some areas are sore to the touch.  He then felt a small subcutaneous lump medial to his knee.  The past day began having similar pain in his right leg.  He called his PCP and was advised to come the emergency room of concern for possible DVT.  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  No swelling.  No bruising.  Denies any recent trauma or strain.  Past Medical History:  Diagnosis Date  . Cancer [facility]) 2003   testicular  . Kidney stones     Patient Active Problem List   Diagnosis Date Noted  . Ureteral calculus, left 07/04/2015  . Renal colic on right side 23/55/7322  . Nephrolithiasis 03/25/2015  . COCCYGEAL PAIN 09/23/2007  . ANKLE SPRAIN, RIGHT 09/23/2007    Past Surgical History:  Procedure Laterality Date  . CYSTOSCOPY W/ URETERAL STENT PLACEMENT Right 03/25/2015   Procedure: CYSTOSCOPY, RETROGRADE PYELOGRAM  WITH RIGHT URETERAL STENT PLACEMENT;  Surgeon: [name], MD;  Location: [facility];  Service: Urology;  Laterality: Right;  . CYSTOSCOPY WITH RETROGRADE PYELOGRAM, URETEROSCOPY AND STENT PLACEMENT Left 07/04/2015   Procedure: CYSTOSCOPY WITH RETROGRADE PYELOGRAM, URETEROSCOPY AND STENT PLACEMENT;  Surgeon: [name], MD;  Location: [facility];  Service: Urology;  Laterality: Left;  With Holmium Laser  . SURGERY SCROTAL / TESTICULAR  2003   for testicular cancer        Home Medications    Prior to Admission medications   Medication Sig Start Date End Date Taking? Authorizing Provider  ibuprofen (ADVIL,MOTRIN) 200 MG tablet Take 600  mg by mouth at bedtime.   Yes [provider]  loratadine (CLARITIN) 10 MG tablet Take 10 mg by mouth daily as needed for allergies.   Yes [provider]    Family History No family history on file.  Social History Social History   Tobacco Use  . Smoking status: Former Research scientist (life sciences)  . Smokeless tobacco: Current User    Types: Chew  Substance Use Topics  . Alcohol use: No  . Drug use: No     Allergies   Bee venom; Shellfish allergy; and Hydromorphone hcl   Review of Systems Review of Systems All systems reviewed and negative, other than as noted in HPI.   Physical Exam Updated Vital Signs BP 128/90 (BP Location: Left Arm)   Pulse 75   Temp 98 F (36.7 C) (Oral)   Resp 18   Ht 5\' 9"  (1.753 m)   Wt 113.4 kg   SpO2 97%   BMI 36.92 kg/m   Physical Exam  Constitutional: He appears well-developed and well-nourished. No distress.  HENT:  Head: Normocephalic and atraumatic.  Eyes: Conjunctivae are normal. Right eye exhibits no discharge. Left eye exhibits no discharge.  Neck: Neck supple.  Cardiovascular: Normal rate, regular rhythm and normal heart sounds. Exam reveals no gallop and no friction rub.  No murmur heard. Pulmonary/Chest: Effort normal and breath sounds normal. No respiratory distress.  Abdominal: Soft. He exhibits no distension. There  is no tenderness.  Musculoskeletal: He exhibits no edema or tenderness.  Lower extremities grossly normal appearance and symmetric as compared to each other.  He is obese and his legs are large but they do not seem edematous per se.  I do not palpate any masses.  There is no rash.  Palpable DP pulse.  He is able to actively range at the hip knee and ankle without any apparent discomfort.  Neurological: He is alert.  Skin: Skin is warm and dry.  Psychiatric: He has a normal mood and affect. His behavior is normal. Thought content normal.  Nursing note and vitals reviewed.    ED Treatments / Results   Labs (all labs ordered are listed, but only abnormal results are displayed) Labs Reviewed - No data to display  EKG None  Radiology No results found.  Procedures Procedures (including critical care time)  Medications Ordered in ED Medications - No data to display   Initial Impression / Assessment and Plan / ED Course  I have reviewed the triage vital signs and the nursing notes.  Pertinent labs & imaging results that were available during my care of the patient were reviewed by me and considered in my medical decision making (see chart for details).     47 year old male with bilateral thigh and calf pain.  No trauma or strain.  Imaging negative for DVT.  He is neurovascularly intact.  No signs of infection. I suspect the "lump" he is feeling is the tendinous portion of the the medial gastrocnemius. It's interesting that he feels this and is sore in exactly the same place b/l. I doubt emergent process.   Final Clinical Impressions(s) / ED Diagnoses   Final diagnoses:  Bilateral leg pain    ED Discharge Orders    None       Virgel Manifold, MD 09/02/18 [phone]

## 2018-09-02 NOTE — ED Triage Notes (Signed)
Pt reports that Thursday having posterior leg pais. Noticed lump on left posterior left knee on Thursday then noticed bruise feeling on right posterior leg.  Pt PCP advised to go to ED to rule DVT.

## 2018-09-02 NOTE — Discharge Instructions (Addendum)
Your ultrasound was negative for DVT. I'm not sure of the exact reason for your symptoms, but I have a low suspicion for an emergent process at this point. I would take ibuprofen 600 mg every 6 hours as needed for pain. Follow-up with your PCP for persistent symptoms.

## 2019-01-09 DIAGNOSIS — G5603 Carpal tunnel syndrome, bilateral upper limbs: Secondary | ICD-10-CM | POA: Diagnosis not present

## 2019-01-10 ENCOUNTER — Ambulatory Visit: Payer: BLUE CROSS/BLUE SHIELD | Admitting: Podiatry

## 2019-01-13 ENCOUNTER — Encounter: Payer: Self-pay | Admitting: Podiatry

## 2019-01-13 ENCOUNTER — Ambulatory Visit: Payer: BLUE CROSS/BLUE SHIELD | Admitting: Podiatry

## 2019-01-13 DIAGNOSIS — L600 Ingrowing nail: Secondary | ICD-10-CM | POA: Diagnosis not present

## 2019-01-13 MED ORDER — NEOMYCIN-POLYMYXIN-HC 1 % OT SOLN: OTIC | 1 refills | Status: DC

## 2019-01-13 NOTE — Patient Instructions (Signed)

## 2019-01-14 NOTE — Progress Notes (Signed)
Subjective:  Patient ID: Sean [name], male    DOB: Aug 15, 1971,  MRN: [id number] HPI Chief Complaint  Patient presents with  . Nail Problem    Patient presents today for ingrown toenail left hallux medial border off and on x years.  He states "its not bad right now, but my nail curves in and it can be tender when I hit it"   He reports the only treatment has been getting pedicures which they clip some of the nail out    48 y.o. male presents with the above complaint.   ROS: He denies fever chills nausea vomiting muscle aches pains calf pain back pain chest pain shortness of breath.  Past Medical History:  Diagnosis Date  . Cancer [facility]) 2003   testicular  . Kidney stones    Past Surgical History:  Procedure Laterality Date  . CYSTOSCOPY W/ URETERAL STENT PLACEMENT Right 03/25/2015   Procedure: CYSTOSCOPY, RETROGRADE PYELOGRAM  WITH RIGHT URETERAL STENT PLACEMENT;  Surgeon: [name], MD;  Location: [facility];  Service: Urology;  Laterality: Right;  . CYSTOSCOPY WITH RETROGRADE PYELOGRAM, URETEROSCOPY AND STENT PLACEMENT Left 07/04/2015   Procedure: CYSTOSCOPY WITH RETROGRADE PYELOGRAM, URETEROSCOPY AND STENT PLACEMENT;  Surgeon: [name], MD;  Location: [facility];  Service: Urology;  Laterality: Left;  With Holmium Laser  . SURGERY SCROTAL / TESTICULAR  2003   for testicular cancer    Current Outpatient Medications:  .  EPINEPHrine 0.3 mg/0.3 mL IJ SOAJ injection, epinephrine 0.3 mg/0.3 mL injection, auto-injector, Disp: , Rfl:  .  ibuprofen (ADVIL,MOTRIN) 200 MG tablet, Take 600 mg by mouth at bedtime., Disp: , Rfl:  .  loratadine (CLARITIN) 10 MG tablet, Take 10 mg by mouth daily as needed for allergies., Disp: , Rfl:  .  NEOMYCIN-POLYMYXIN-HYDROCORTISONE (CORTISPORIN) 1 % SOLN OTIC solution, Apply 1-2 drops to toe BID after soaking, Disp: 10 mL, Rfl: 1  Allergies  Allergen Reactions  . Bee Venom Anaphylaxis  . Shellfish Allergy Anaphylaxis  . Hydromorphone Hcl  Nausea And Vomiting    DILAUDID   Review of Systems Objective:  There were no vitals filed for this visit.  General: Well developed, nourished, in no acute distress, alert and oriented x3   Dermatological: Skin is warm, dry and supple bilateral. Nails x 10 are well maintained; remaining integument appears unremarkable at this time. There are no open sores, no preulcerative lesions, no rash or signs of infection present.  Sharp incurvated nail margin tibiofibular border of the left hallux.  Exquisitely painful on palpation of the nail is malformed.  Vascular: Dorsalis Pedis artery and Posterior Tibial artery pedal pulses are 2/4 bilateral with immedate capillary fill time. Pedal hair growth present. No varicosities and no lower extremity edema present bilateral.   Neruologic: Grossly intact via light touch bilateral. Vibratory intact via tuning fork bilateral. Protective threshold with Semmes Wienstein monofilament intact to all pedal sites bilateral. Patellar and Achilles deep tendon reflexes 2+ bilateral. No Babinski or clonus noted bilateral.   Musculoskeletal: No gross boney pedal deformities bilateral. No pain, crepitus, or limitation noted with foot and ankle range of motion bilateral. Muscular strength 5/5 in all groups tested bilateral.  Gait: Unassisted, Nonantalgic.    Radiographs:  Radiographs not taken today.  Assessment & Plan:   Assessment: Ingrown toenail with onychocryptosis hallux left  Plan: Total chemical matricectomy was performed today to the hallux nail plate left he tolerated procedure well after local anesthetic of 3 cc of a 50-50 mixture of Marcaine plain  lidocaine plain was infiltrated.  He received both oral and written home-going structures for care and soaking of the toe as well as a prescription for Cortisporin Otic to be applied twice daily after soaking.  Follow-up with him in 1 week     Ahmon Tosi T. [place], [place]

## 2019-01-20 ENCOUNTER — Ambulatory Visit: Payer: BLUE CROSS/BLUE SHIELD | Admitting: Podiatry

## 2019-01-21 DIAGNOSIS — M65331 Trigger finger, right middle finger: Secondary | ICD-10-CM | POA: Diagnosis not present

## 2019-01-21 DIAGNOSIS — G5603 Carpal tunnel syndrome, bilateral upper limbs: Secondary | ICD-10-CM | POA: Diagnosis not present

## 2019-01-29 ENCOUNTER — Ambulatory Visit: Payer: BLUE CROSS/BLUE SHIELD | Admitting: Podiatry

## 2019-03-06 ENCOUNTER — Ambulatory Visit (INDEPENDENT_AMBULATORY_CARE_PROVIDER_SITE_OTHER): Payer: BLUE CROSS/BLUE SHIELD | Admitting: Podiatry

## 2019-03-06 ENCOUNTER — Encounter: Payer: Self-pay | Admitting: Podiatry

## 2019-03-06 ENCOUNTER — Other Ambulatory Visit: Payer: Self-pay

## 2019-03-06 VITALS — Temp 97.6°F

## 2019-03-06 DIAGNOSIS — L03032 Cellulitis of left toe: Secondary | ICD-10-CM | POA: Diagnosis not present

## 2019-03-06 MED ORDER — DOXYCYCLINE HYCLATE 100 MG PO TABS: 100.0000 mg | ORAL_TABLET | Freq: Two times a day (BID) | ORAL | 0 refills | Status: DC

## 2019-03-06 NOTE — Patient Instructions (Signed)

## 2019-03-06 NOTE — Progress Notes (Signed)
He presents today concerned about the nail avulsion to the hallux left states that is still tender and sometimes throbbing.  States that he is been soaking at least once a day in some Epsom salts and warm water for about 15 minutes.  Had initially stopped soaking a week or so after the procedure was performed but then was told to continue to soak.  Objective: Vital signs are stable he is alert and oriented x3.  Pulses are palpable.  The toe demonstrates mild erythema proximally no cellulitis no drainage no better granulation tissue and epithelialization is occurring and appears to be normal.  Is mildly tender on palpation of the proximal nail fold.  No purulence no malodor no exudate.  Assessment: Mild paronychia possibly associated with nail avulsion and chemical cauterization.  Plan: I put him on doxycycline and encouraged him to soak every other day and a extra concentrated Epson salts hopefully this will help his alleviate his symptoms should he not improve over the next week to 2 weeks he will notify us immediately.

## 2019-04-02 DIAGNOSIS — Z0000 Encounter for general adult medical examination without abnormal findings: Secondary | ICD-10-CM | POA: Diagnosis not present

## 2019-04-02 DIAGNOSIS — R5383 Other fatigue: Secondary | ICD-10-CM | POA: Diagnosis not present

## 2019-04-02 DIAGNOSIS — Z6838 Body mass index (BMI) 38.0-38.9, adult: Secondary | ICD-10-CM | POA: Diagnosis not present

## 2019-04-02 DIAGNOSIS — Z125 Encounter for screening for malignant neoplasm of prostate: Secondary | ICD-10-CM | POA: Diagnosis not present

## 2019-04-02 DIAGNOSIS — E785 Hyperlipidemia, unspecified: Secondary | ICD-10-CM | POA: Diagnosis not present

## 2019-04-02 DIAGNOSIS — M1991 Primary osteoarthritis, unspecified site: Secondary | ICD-10-CM | POA: Diagnosis not present

## 2019-04-02 DIAGNOSIS — C6210 Malignant neoplasm of unspecified descended testis: Secondary | ICD-10-CM | POA: Diagnosis not present

## 2019-04-02 DIAGNOSIS — Z0001 Encounter for general adult medical examination with abnormal findings: Secondary | ICD-10-CM | POA: Diagnosis not present

## 2019-05-02 DIAGNOSIS — Z6838 Body mass index (BMI) 38.0-38.9, adult: Secondary | ICD-10-CM | POA: Diagnosis not present

## 2019-05-02 DIAGNOSIS — Z1389 Encounter for screening for other disorder: Secondary | ICD-10-CM | POA: Diagnosis not present

## 2019-05-02 DIAGNOSIS — W64XXXA Exposure to other animate mechanical forces, initial encounter: Secondary | ICD-10-CM | POA: Diagnosis not present

## 2019-05-02 DIAGNOSIS — L259 Unspecified contact dermatitis, unspecified cause: Secondary | ICD-10-CM | POA: Diagnosis not present

## 2019-05-05 ENCOUNTER — Encounter: Payer: Self-pay | Admitting: Emergency Medicine

## 2019-05-05 ENCOUNTER — Other Ambulatory Visit: Payer: Self-pay

## 2019-05-05 DIAGNOSIS — T7840XA Allergy, unspecified, initial encounter: Secondary | ICD-10-CM | POA: Diagnosis not present

## 2019-05-05 DIAGNOSIS — F17220 Nicotine dependence, chewing tobacco, uncomplicated: Secondary | ICD-10-CM | POA: Diagnosis not present

## 2019-05-05 DIAGNOSIS — L299 Pruritus, unspecified: Secondary | ICD-10-CM | POA: Diagnosis not present

## 2019-05-05 DIAGNOSIS — L509 Urticaria, unspecified: Secondary | ICD-10-CM | POA: Diagnosis not present

## 2019-05-05 DIAGNOSIS — D72829 Elevated white blood cell count, unspecified: Secondary | ICD-10-CM | POA: Diagnosis not present

## 2019-05-05 DIAGNOSIS — L500 Allergic urticaria: Secondary | ICD-10-CM | POA: Diagnosis not present

## 2019-05-05 DIAGNOSIS — L508 Other urticaria: Secondary | ICD-10-CM | POA: Diagnosis not present

## 2019-05-05 NOTE — ED Triage Notes (Signed)
Patient ambulatory to triage with steady gait, without difficulty or distress noted, mask in place; pt reports was mowing on Thursday and bit by possible ant; began having swelling to underside of penis; received solumedrol inj on Friday with relief; began having itchy rash on Saturday (used new type of laundry detergent) and was rx prednisone; st itchy rash persists; took 1 benadryl at noon today

## 2019-05-06 ENCOUNTER — Emergency Department
Admission: EM | Admit: 2019-05-06 | Discharge: 2019-05-06 | Disposition: A | Discharge status: Home / Self Care | Payer: BC Managed Care – PPO | Attending: Emergency Medicine | Admitting: Emergency Medicine

## 2019-05-06 DIAGNOSIS — L509 Urticaria, unspecified: Secondary | ICD-10-CM

## 2019-05-06 DIAGNOSIS — T7840XA Allergy, unspecified, initial encounter: Secondary | ICD-10-CM

## 2019-05-06 MED ORDER — METHYLPREDNISOLONE SODIUM SUCC 125 MG IJ SOLR
125.0000 mg | Freq: Once | INTRAMUSCULAR | Status: AC
  Administered 2019-05-06: 125 mg via INTRAVENOUS
  Filled 2019-05-06: qty 2

## 2019-05-06 MED ORDER — FAMOTIDINE IN NACL 20-0.9 MG/50ML-% IV SOLN
20.0000 mg | Freq: Once | INTRAVENOUS | Status: AC
  Administered 2019-05-06: 20 mg via INTRAVENOUS
  Filled 2019-05-06: qty 50

## 2019-05-06 MED ORDER — PREDNISONE 20 MG PO TABS: ORAL_TABLET | ORAL | 0 refills | Status: DC

## 2019-05-06 MED ORDER — DIPHENHYDRAMINE HCL 50 MG/ML IJ SOLN
25.0000 mg | Freq: Once | INTRAMUSCULAR | Status: AC
  Administered 2019-05-06: 25 mg via INTRAVENOUS
  Filled 2019-05-06: qty 1

## 2019-05-06 MED ORDER — EPINEPHRINE 0.3 MG/0.3ML IJ SOAJ: 0.3000 mg | Freq: Once | INTRAMUSCULAR | 2 refills | Status: AC

## 2019-05-06 MED ORDER — LORAZEPAM 2 MG/ML IJ SOLN
0.5000 mg | Freq: Once | INTRAMUSCULAR | Status: AC
  Administered 2019-05-06: 0.5 mg via INTRAVENOUS
  Filled 2019-05-06: qty 1

## 2019-05-06 MED ORDER — SODIUM CHLORIDE 0.9 % IV BOLUS
1000.0000 mL | Freq: Once | INTRAVENOUS | Status: AC
  Administered 2019-05-06: 1000 mL via INTRAVENOUS

## 2019-05-06 MED ORDER — FAMOTIDINE 20 MG PO TABS: 20.0000 mg | ORAL_TABLET | Freq: Two times a day (BID) | ORAL | 0 refills | Status: DC

## 2019-05-06 NOTE — ED Provider Notes (Signed)
[facility] Emergency Department Provider Note   ____________________________________________   First MD Initiated Contact with Patient 05/06/19 0133     (approximate)  I have reviewed the triage vital signs and the nursing notes.   HISTORY  Chief Complaint Allergic Reaction    HPI Sean [name] is a 48 y.o. male who presents to the ED from home with a chief complaint of urticaria.  Patient reports he was mowing the yard 4 days ago and thinks he was bitten by fire ants.  Began to have hives and swelling to the underside of his penis.  Saw his PCP the following day and received Solu-Medrol injection and started on prednisone taper.  Symptoms resolved but then he began to have a diffuse itchy rash 2 days ago.  Denies new foods or medicines.  Does state he is used using a new type of laundry detergent.  Took Benadryl at noon.  Denies face or mouth swelling, throat constriction, chest pain, shortness of breath, abdominal pain, nausea or vomiting.  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       Past Medical History:  Diagnosis Date  . Cancer [facility]) 2003   testicular  . Kidney stones     Patient Active Problem List   Diagnosis Date Noted  . Carpal tunnel syndrome 08/16/2018  . Ureteral calculus, left 07/04/2015  . Renal colic on right side 29/47/6546  . Nephrolithiasis 03/25/2015  . COCCYGEAL PAIN 09/23/2007  . ANKLE SPRAIN, RIGHT 09/23/2007    Past Surgical History:  Procedure Laterality Date  . CYSTOSCOPY W/ URETERAL STENT PLACEMENT Right 03/25/2015   Procedure: CYSTOSCOPY, RETROGRADE PYELOGRAM  WITH RIGHT URETERAL STENT PLACEMENT;  Surgeon: [name], MD;  Location: [facility];  Service: Urology;  Laterality: Right;  . CYSTOSCOPY WITH RETROGRADE PYELOGRAM, URETEROSCOPY AND STENT PLACEMENT Left 07/04/2015   Procedure: CYSTOSCOPY WITH RETROGRADE PYELOGRAM, URETEROSCOPY AND STENT PLACEMENT;  Surgeon: [name],  MD;  Location: [facility];  Service: Urology;  Laterality: Left;  With Holmium Laser  . 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 Authorizing Provider  doxycycline (VIBRA-TABS) 100 MG tablet Take 1 tablet (100 mg total) by mouth 2 (two) times daily. 03/06/19   Hyatt, Max T, DPM  EPINEPHrine 0.3 mg/0.3 mL IJ SOAJ injection epinephrine 0.3 mg/0.3 mL injection, auto-injector    [provider]  EPINEPHrine 0.3 mg/0.3 mL IJ SOAJ injection Inject 0.3 mLs (0.3 mg total) into the muscle once for 1 dose. 05/06/19 05/06/19  Paulette Blanch, MD  famotidine (PEPCID) 20 MG tablet Take 1 tablet (20 mg total) by mouth 2 (two) times daily. 05/06/19   Paulette Blanch, MD  ibuprofen (ADVIL,MOTRIN) 200 MG tablet Take 600 mg by mouth at bedtime.    [provider]  loratadine (CLARITIN) 10 MG tablet Take 10 mg by mouth daily as needed for allergies.    [provider]  NEOMYCIN-POLYMYXIN-HYDROCORTISONE (CORTISPORIN) 1 % SOLN OTIC solution Apply 1-2 drops to toe BID after soaking 01/13/19   Hyatt, Max T, DPM  predniSONE (DELTASONE) 20 MG tablet 3 tablets daily x 4 days 05/06/19   Paulette Blanch, MD    Allergies Bee venom, Shellfish allergy, and Hydromorphone hcl  No family history on file.  Social History Social History   Tobacco Use  . Smoking status: Former Research scientist (life sciences)  .  Smokeless tobacco: Current User    Types: Chew  Substance Use Topics  . Alcohol use: No  . Drug use: No    Review of Systems  Constitutional: No fever/chills Eyes: No visual changes. ENT: No sore throat. Cardiovascular: Denies chest pain. Respiratory: Denies shortness of breath. Gastrointestinal: No abdominal pain.  No nausea, no vomiting.  No diarrhea.  No constipation. Genitourinary: Negative for dysuria. Musculoskeletal: Negative for back pain. Skin: Positive for itchy rash. Neurological: Negative for headaches, focal  weakness or numbness.   ____________________________________________   PHYSICAL EXAM:  VITAL SIGNS: ED Triage Vitals  Enc Vitals Group     BP 05/05/19 2225 (!) 150/73     Pulse Rate 05/05/19 2225 90     Resp 05/05/19 2225 18     Temp 05/05/19 2225 97.7 F (36.5 C)     Temp Source 05/05/19 2225 Oral     SpO2 05/05/19 2225 99 %     Weight 05/05/19 2222 265 lb (120.2 kg)     Height 05/05/19 2222 5\' 9"  (1.753 m)     Head Circumference --      Peak Flow --      Pain Score 05/05/19 2220 0     Pain Loc --      Pain Edu? --      Excl. in [place]? --     Constitutional: Alert and oriented. Well appearing and in mild acute distress. Eyes: Conjunctivae are normal. PERRL. EOMI. Head: Atraumatic. Nose: No congestion/rhinnorhea. Mouth/Throat: Mucous membranes are moist.  No tongue or lip angioedema.  There is no hoarse or muffled voice.  There is no drooling. Neck: No stridor.  Soft submental space. Cardiovascular: Normal rate, regular rhythm. Grossly normal heart sounds.  Good peripheral circulation. Respiratory: Normal respiratory effort.  No retractions. Lungs CTAB. Gastrointestinal: Soft and nontender. No distention. No abdominal bruits. No CVA tenderness. Genitourinary: Deferred by patient who states pain is has returned back to normal. Musculoskeletal: No lower extremity tenderness nor edema.  No joint effusions. Neurologic:  Normal speech and language. No gross focal neurologic deficits are appreciated. No gait instability. Skin:  Skin is warm, dry and intact.  Diffuse urticaria noted. Psychiatric: Mood and affect are normal. Speech and behavior are normal.  ____________________________________________   LABS (all labs ordered are listed, but only abnormal results are displayed)  Labs Reviewed - No data to display ____________________________________________  EKG  None ____________________________________________  RADIOLOGY  ED MD interpretation: None  Official  radiology report(s): No results found.  ____________________________________________   PROCEDURES  Procedure(s) performed (including Critical Care):  Procedures   ____________________________________________   INITIAL IMPRESSION / ASSESSMENT AND PLAN / ED COURSE  As part of my medical decision making, I reviewed the following data within the [facility] notes reviewed and incorporated, Old chart reviewed and Notes from prior ED visits     Sean [name] was evaluated in Emergency Department on 05/06/2019 for the symptoms described in the history of present illness. He was evaluated in the context of the global COVID-19 pandemic, which necessitated consideration that the patient might be at risk for infection with the SARS-CoV-2 virus that causes COVID-19. Institutional protocols and algorithms that pertain to the evaluation of patients at risk for COVID-19 are in a state of rapid change based on information released by regulatory bodies including the CDC and federal and state organizations. These policies and algorithms were followed during the patient's care in the ED.   48 year old male who presents  with acute allergic reaction with urticaria.  On prednisone taper.  Will initiate IV fluid resuscitation, IV Benadryl, Solu-Medrol and Pepcid.  Will monitor in the ED.   Clinical Course as of May 05 448  Tue May 06, 2019  0300 Hives looking significantly better.  Patient feeling jittery, likely due to IV Benadryl.  Will administer low-dose Ativan.   [JS]  0402 Patient feeling better; no jitteriness.  Hives have virtually resolved.  There is no angioedema.  Has an allergist appointment on July 14.  We will also provide local resources for follow-up.  Will discharge home on prednisone burst, Pepcid.  Encourage patient to take continuous Benadryl for the next 48 hours.  Also encouraged him to restart his over-the-counter allergy medicine which he has not taken for  the past week.  We will also refill EpiPen.  Strict return precautions given.  Patient verbalizes understanding agrees with plan of care.   [JS]    Clinical Course User Index [JS] Paulette Blanch, MD     ____________________________________________   FINAL CLINICAL IMPRESSION(S) / ED DIAGNOSES  Final diagnoses:  Allergic reaction, initial encounter  Urticaria     ED Discharge Orders         Ordered    predniSONE (DELTASONE) 20 MG tablet     05/06/19 0404    EPINEPHrine 0.3 mg/0.3 mL IJ SOAJ injection   Once     05/06/19 0404    famotidine (PEPCID) 20 MG tablet  2 times daily     05/06/19 0404           Note:  This document was prepared using Dragon voice recognition software and may include unintentional dictation errors.   Paulette Blanch, MD 05/06/19 [phone]

## 2019-05-06 NOTE — ED Notes (Signed)
Pt reports increasing rash/hives; acuity change and will take pt to next available exam room

## 2019-05-06 NOTE — Discharge Instructions (Addendum)
1. Take the following medicines for the next 4 days: Prednisone 60mg  daily Pepcid 20mg  twice daily 2. Take Benadryl every 6 hours over the next 48 hours for itching. 3. Use Epi-Pen in case of acute, life-threatening allergic reaction. 4.  Restart your daily over-the-counter allergy pill. 5. Return to the ER for worsening symptoms, persistent vomiting, difficulty breathing or other concerns.

## 2019-05-06 NOTE — ED Notes (Signed)
Warm blanket given

## 2019-05-07 ENCOUNTER — Ambulatory Visit: Payer: BLUE CROSS/BLUE SHIELD | Admitting: Allergy

## 2019-05-07 ENCOUNTER — Ambulatory Visit: Payer: BLUE CROSS/BLUE SHIELD | Admitting: Allergy and Immunology

## 2019-05-07 DIAGNOSIS — L299 Pruritus, unspecified: Secondary | ICD-10-CM | POA: Diagnosis not present

## 2019-05-07 DIAGNOSIS — D72829 Elevated white blood cell count, unspecified: Secondary | ICD-10-CM | POA: Diagnosis not present

## 2019-05-07 DIAGNOSIS — J301 Allergic rhinitis due to pollen: Secondary | ICD-10-CM | POA: Diagnosis not present

## 2019-05-07 DIAGNOSIS — L509 Urticaria, unspecified: Secondary | ICD-10-CM | POA: Diagnosis not present

## 2019-05-07 DIAGNOSIS — L298 Other pruritus: Secondary | ICD-10-CM | POA: Diagnosis not present

## 2019-05-07 DIAGNOSIS — R7989 Other specified abnormal findings of blood chemistry: Secondary | ICD-10-CM | POA: Diagnosis not present

## 2019-05-07 DIAGNOSIS — T7840XA Allergy, unspecified, initial encounter: Secondary | ICD-10-CM | POA: Diagnosis not present

## 2019-05-07 DIAGNOSIS — L508 Other urticaria: Secondary | ICD-10-CM | POA: Diagnosis not present

## 2019-05-27 ENCOUNTER — Ambulatory Visit: Payer: BLUE CROSS/BLUE SHIELD | Admitting: Allergy and Immunology

## 2019-07-01 DIAGNOSIS — G5603 Carpal tunnel syndrome, bilateral upper limbs: Secondary | ICD-10-CM | POA: Diagnosis not present

## 2019-07-01 DIAGNOSIS — M13841 Other specified arthritis, right hand: Secondary | ICD-10-CM | POA: Diagnosis not present

## 2019-07-01 DIAGNOSIS — M65331 Trigger finger, right middle finger: Secondary | ICD-10-CM | POA: Diagnosis not present

## 2019-07-01 DIAGNOSIS — M13842 Other specified arthritis, left hand: Secondary | ICD-10-CM | POA: Diagnosis not present

## 2019-10-21 DIAGNOSIS — B349 Viral infection, unspecified: Secondary | ICD-10-CM | POA: Diagnosis not present

## 2019-10-21 DIAGNOSIS — M13842 Other specified arthritis, left hand: Secondary | ICD-10-CM | POA: Diagnosis not present

## 2019-10-21 DIAGNOSIS — G5603 Carpal tunnel syndrome, bilateral upper limbs: Secondary | ICD-10-CM | POA: Diagnosis not present

## 2019-10-21 DIAGNOSIS — Z03818 Encounter for observation for suspected exposure to other biological agents ruled out: Secondary | ICD-10-CM | POA: Diagnosis not present

## 2019-10-21 DIAGNOSIS — M13841 Other specified arthritis, right hand: Secondary | ICD-10-CM | POA: Diagnosis not present

## 2020-01-30 DIAGNOSIS — G5603 Carpal tunnel syndrome, bilateral upper limbs: Secondary | ICD-10-CM | POA: Diagnosis not present

## 2020-04-01 DIAGNOSIS — M5136 Other intervertebral disc degeneration, lumbar region: Secondary | ICD-10-CM | POA: Diagnosis not present

## 2020-04-01 DIAGNOSIS — M545 Low back pain: Secondary | ICD-10-CM | POA: Diagnosis not present

## 2020-06-10 DIAGNOSIS — G5603 Carpal tunnel syndrome, bilateral upper limbs: Secondary | ICD-10-CM | POA: Diagnosis not present

## 2020-09-09 DIAGNOSIS — G5603 Carpal tunnel syndrome, bilateral upper limbs: Secondary | ICD-10-CM | POA: Diagnosis not present

## 2020-09-15 ENCOUNTER — Encounter (HOSPITAL_COMMUNITY): Payer: Self-pay | Admitting: Emergency Medicine

## 2020-09-15 ENCOUNTER — Emergency Department (HOSPITAL_COMMUNITY): Payer: BC Managed Care – PPO

## 2020-09-15 ENCOUNTER — Emergency Department (HOSPITAL_COMMUNITY)
Admission: EM | Admit: 2020-09-15 | Discharge: 2020-09-15 | Disposition: A | Discharge status: Home / Self Care | Payer: BC Managed Care – PPO | Attending: Emergency Medicine | Admitting: Emergency Medicine

## 2020-09-15 ENCOUNTER — Other Ambulatory Visit: Payer: Self-pay

## 2020-09-15 DIAGNOSIS — R200 Anesthesia of skin: Secondary | ICD-10-CM | POA: Diagnosis not present

## 2020-09-15 DIAGNOSIS — R0789 Other chest pain: Secondary | ICD-10-CM | POA: Diagnosis not present

## 2020-09-15 DIAGNOSIS — Z87891 Personal history of nicotine dependence: Secondary | ICD-10-CM | POA: Insufficient documentation

## 2020-09-15 DIAGNOSIS — Z8547 Personal history of malignant neoplasm of testis: Secondary | ICD-10-CM | POA: Diagnosis not present

## 2020-09-15 DIAGNOSIS — M5410 Radiculopathy, site unspecified: Secondary | ICD-10-CM | POA: Insufficient documentation

## 2020-09-15 DIAGNOSIS — M5416 Radiculopathy, lumbar region: Secondary | ICD-10-CM | POA: Diagnosis not present

## 2020-09-15 LAB — CBC WITH DIFFERENTIAL/PLATELET
Abs Immature Granulocytes: 0.13 10*3/uL — ABNORMAL HIGH (ref 0.00–0.07)
Basophils Absolute: 0.1 10*3/uL (ref 0.0–0.1)
Basophils Relative: 1 %
Eosinophils Absolute: 0.3 10*3/uL (ref 0.0–0.5)
Eosinophils Relative: 3 %
HCT: 52 % (ref 39.0–52.0)
Hemoglobin: 16.8 g/dL (ref 13.0–17.0)
Immature Granulocytes: 1 %
Lymphocytes Relative: 29 %
Lymphs Abs: 2.9 10*3/uL (ref 0.7–4.0)
MCH: 29.3 pg (ref 26.0–34.0)
MCHC: 32.3 g/dL (ref 30.0–36.0)
MCV: 90.6 fL (ref 80.0–100.0)
Monocytes Absolute: 1 10*3/uL (ref 0.1–1.0)
Monocytes Relative: 10 %
Neutro Abs: 5.5 10*3/uL (ref 1.7–7.7)
Neutrophils Relative %: 56 %
Platelets: 253 10*3/uL (ref 150–400)
RBC: 5.74 MIL/uL (ref 4.22–5.81)
RDW: 13.4 % (ref 11.5–15.5)
WBC: 9.9 10*3/uL (ref 4.0–10.5)
nRBC: 0 % (ref 0.0–0.2)

## 2020-09-15 LAB — BASIC METABOLIC PANEL
Anion gap: 8 (ref 5–15)
BUN: 13 mg/dL (ref 6–20)
CO2: 25 mmol/L (ref 22–32)
Calcium: 8.6 mg/dL — ABNORMAL LOW (ref 8.9–10.3)
Chloride: 104 mmol/L (ref 98–111)
Creatinine, Ser: 0.84 mg/dL (ref 0.61–1.24)
GFR, Estimated: >60 mL/min (ref >60)
Glucose, Bld: 88 mg/dL (ref 70–99)
Potassium: 3.8 mmol/L (ref 3.5–5.1)
Sodium: 137 mmol/L (ref 135–145)

## 2020-09-15 LAB — TROPONIN I (HIGH SENSITIVITY): Troponin I (High Sensitivity): 2 ng/L (ref <18)

## 2020-09-15 MED ORDER — HYDROCODONE-ACETAMINOPHEN 5-325 MG PO TABS: 1.0000 | ORAL_TABLET | ORAL | 0 refills | Status: DC | PRN

## 2020-09-15 MED ORDER — METHOCARBAMOL 500 MG PO TABS: 500.0000 mg | ORAL_TABLET | Freq: Four times a day (QID) | ORAL | 0 refills | Status: DC

## 2020-09-15 MED ORDER — PREDNISONE 50 MG PO TABS: ORAL_TABLET | ORAL | 0 refills | Status: DC

## 2020-09-15 NOTE — ED Triage Notes (Signed)
Pt reports received bilateral hand/wrist injections for carpal tunnel. Pt reports last injection on Thursday. Pt reports ever since has had right arm weakness that extend to right shoulder and right chest. Pt reports history of pulled muscle in chest and reports "this feels similar." pt denies any known injury. No obvious deformity noted.

## 2020-09-15 NOTE — Discharge Instructions (Addendum)
Follow up with Dr. Gerarda Fraction for a complete physical.  Follow up with your Orthopaedist for evaluation of arm pain

## 2020-09-16 DIAGNOSIS — M542 Cervicalgia: Secondary | ICD-10-CM | POA: Diagnosis not present

## 2020-09-16 NOTE — ED Provider Notes (Signed)
[facility] EMERGENCY DEPARTMENT Provider Note   CSN: [id number] Arrival date & time: 09/15/20  1303     History Chief Complaint  Patient presents with  . Arm Pain  . Chest Pain    Sean [name] is a 49 y.o. male.  The history is provided by the patient. No language interpreter was used.  Arm Pain This is a new problem. The problem occurs constantly. The problem has been gradually worsening. Associated symptoms include chest pain. Nothing aggravates the symptoms. Nothing relieves the symptoms. He has tried nothing for the symptoms. The treatment provided no relief.  Chest Pain  Pt complains of pain in his right arm.  Pt reports he has carpal tunnel syndrome and had steroid injections. Pt reports he feels like he has a pulled muscle in his arm and upper chest. Pt denies any injury.   Pt's wife is concerned about possible heart disease.      Past Medical History:  Diagnosis Date  . Cancer [facility]) 2003   testicular  . Kidney stones     Patient Active Problem List   Diagnosis Date Noted  . Carpal tunnel syndrome 08/16/2018  . Ureteral calculus, left 07/04/2015  . Renal colic on right side 58/52/7782  . Nephrolithiasis 03/25/2015  . COCCYGEAL PAIN 09/23/2007  . ANKLE SPRAIN, RIGHT 09/23/2007    Past Surgical History:  Procedure Laterality Date  . CYSTOSCOPY W/ URETERAL STENT PLACEMENT Right 03/25/2015   Procedure: CYSTOSCOPY, RETROGRADE PYELOGRAM  WITH RIGHT URETERAL STENT PLACEMENT;  Surgeon: [name], MD;  Location: [facility];  Service: Urology;  Laterality: Right;  . CYSTOSCOPY WITH RETROGRADE PYELOGRAM, URETEROSCOPY AND STENT PLACEMENT Left 07/04/2015   Procedure: CYSTOSCOPY WITH RETROGRADE PYELOGRAM, URETEROSCOPY AND STENT PLACEMENT;  Surgeon: [name], MD;  Location: [facility];  Service: Urology;  Laterality: Left;  With Holmium Laser  . SURGERY SCROTAL / TESTICULAR  2003   for testicular cancer       History reviewed. No pertinent family  history.  Social History   Tobacco Use  . Smoking status: Former Research scientist (life sciences)  . Smokeless tobacco: Current User    Types: Chew  Vaping Use  . Vaping Use: Never used  Substance Use Topics  . Alcohol use: No  . Drug use: No    Home Medications Prior to Admission medications   Medication Sig Start Date End Date Taking? Authorizing Provider  doxycycline (VIBRA-TABS) 100 MG tablet Take 1 tablet (100 mg total) by mouth 2 (two) times daily. 03/06/19   Hyatt, Max T, DPM  EPINEPHrine 0.3 mg/0.3 mL IJ SOAJ injection epinephrine 0.3 mg/0.3 mL injection, auto-injector    [provider]  famotidine (PEPCID) 20 MG tablet Take 1 tablet (20 mg total) by mouth 2 (two) times daily. 05/06/19   Paulette Blanch, MD  HYDROcodone-acetaminophen (NORCO/VICODIN) 5-325 MG tablet Take 1 tablet by mouth every 4 (four) hours as needed. 09/15/20   Fransico Meadow, PA-C  ibuprofen (ADVIL,MOTRIN) 200 MG tablet Take 600 mg by mouth at bedtime.    [provider]  loratadine (CLARITIN) 10 MG tablet Take 10 mg by mouth daily as needed for allergies.    [provider]  methocarbamol (ROBAXIN) 500 MG tablet Take 1 tablet (500 mg total) by mouth 4 (four) times daily. 09/15/20   Fransico Meadow, PA-C  NEOMYCIN-POLYMYXIN-HYDROCORTISONE (CORTISPORIN) 1 % SOLN OTIC solution Apply 1-2 drops to toe BID after soaking 01/13/19   Hyatt, Max T, DPM  predniSONE (DELTASONE) 50 MG tablet One tablet  a day 09/15/20   Fransico Meadow, PA-C    Allergies    Bee venom, Shellfish allergy, and Hydromorphone hcl  Review of Systems   Review of Systems  Cardiovascular: Positive for chest pain.  All other systems reviewed and are negative.   Physical Exam Updated Vital Signs BP 129/85 (BP Location: Left Arm)   Pulse 63   Temp 98.4 F (36.9 C) (Oral)   Resp 14   Ht 5\' 9"  (1.753 m)   Wt 117.9 kg   SpO2 96%   BMI 38.40 kg/m   Physical Exam Vitals and nursing note reviewed.  Constitutional:      Appearance: He is  well-developed.  HENT:     Head: Normocephalic and atraumatic.  Eyes:     Conjunctiva/sclera: Conjunctivae normal.  Cardiovascular:     Rate and Rhythm: Normal rate and regular rhythm.     Heart sounds: Normal heart sounds. No murmur heard.   Pulmonary:     Effort: Pulmonary effort is normal. No respiratory distress.     Breath sounds: Normal breath sounds.  Abdominal:     Palpations: Abdomen is soft.     Tenderness: There is no abdominal tenderness.  Musculoskeletal:     Cervical back: Neck supple.     Comments: Tender right shoulder, pain with movement,  Slight pain with turning head.   Skin:    General: Skin is warm and dry.  Neurological:     General: No focal deficit present.     Mental Status: He is alert.     ED Results / Procedures / Treatments   Labs (all labs ordered are listed, but only abnormal results are displayed) Labs Reviewed  CBC WITH DIFFERENTIAL/PLATELET - Abnormal; Notable for the following components:      Result Value   Abs Immature Granulocytes 0.13 (*)    All other components within normal limits  BASIC METABOLIC PANEL - Abnormal; Notable for the following components:   Calcium 8.6 (*)    All other components within normal limits  TROPONIN I (HIGH SENSITIVITY)    EKG EKG Interpretation  Date/Time:  Wednesday September 15 2020 14:21:20 EDT Ventricular Rate:  72 PR Interval:    QRS Duration: 110 QT Interval:  397 QTC Calculation: 435 R Axis:   67 Text Interpretation: Sinus rhythm Baseline wander in lead(s) V3 Confirmed by Gerlene Fee [phone]) on 09/15/2020 2:49:32 PM   Radiology DG Chest 2 View  Result Date: 09/15/2020 CLINICAL DATA:  Right arm numbness. EXAM: CHEST - 2 VIEW COMPARISON:  August 20, 2013. FINDINGS: The heart size and mediastinal contours are within normal limits. Both lungs are clear. No pneumothorax or pleural effusion is noted. The visualized skeletal structures are unremarkable. IMPRESSION: No active cardiopulmonary  disease. Electronically Signed   By: Marijo Conception M.D.   On: 09/15/2020 14:30   DG Cervical Spine Complete  Result Date: 09/15/2020 CLINICAL DATA:  Right arm numbness. EXAM: CERVICAL SPINE - COMPLETE 4+ VIEW COMPARISON:  None. FINDINGS: Limited evaluation below C6 secondary to overlapping structures. There is no evidence of cervical spine fracture or prevertebral soft tissue swelling. Straightening of the normal cervical lordosis without substantial subluxation. Mild degenerative disease at C5-C6. IMPRESSION: 1. No evidence of acute fracture or malalignment. 2. 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 Electronically Signed   By: Margaretha Sheffield MD   On: 09/15/2020 14:31    Procedures Procedures (including critical care time)  Medications Ordered in ED Medications - No data to display  ED Course  I have reviewed the triage vital signs and the nursing notes.  Pertinent labs & imaging results that were available during my care of the patient were reviewed by me and considered in my medical decision making (see chart for details).    MDM Rules/Calculators/A&P                          MDM:  Dr. Sedonia Small in to see pt.  Ekg is normal, chest xray okay, c spine shows some degenerative changes.  Pt given rx for prednsione robaxin and hydrocodone. Pt advised to see Dr. Gerarda Fraction for compete evaluation,  Follow up with Orthopaedist for evaluation  Final Clinical Impression(s) / ED Diagnoses Final diagnoses:  Chest wall pain  Radiculopathy, unspecified spinal region    Rx / DC Orders ED Discharge Orders         Ordered    predniSONE (DELTASONE) 50 MG tablet        09/15/20 1624    methocarbamol (ROBAXIN) 500 MG tablet  4 times daily        09/15/20 1624    HYDROcodone-acetaminophen (NORCO/VICODIN) 5-325 MG tablet  Every 4 hours PRN        09/15/20 1654        An After Visit Summary was printed and given to the patient.    Fransico Meadow, PA-C 09/16/20 1611    Maudie Flakes,  MD 09/20/20 [phone]

## 2020-09-20 DIAGNOSIS — M5412 Radiculopathy, cervical region: Secondary | ICD-10-CM | POA: Diagnosis not present

## 2020-09-20 DIAGNOSIS — M62830 Muscle spasm of back: Secondary | ICD-10-CM | POA: Diagnosis not present

## 2020-11-24 DIAGNOSIS — M5412 Radiculopathy, cervical region: Secondary | ICD-10-CM | POA: Diagnosis not present

## 2020-11-24 DIAGNOSIS — Z6838 Body mass index (BMI) 38.0-38.9, adult: Secondary | ICD-10-CM | POA: Diagnosis not present

## 2020-11-24 DIAGNOSIS — Z1331 Encounter for screening for depression: Secondary | ICD-10-CM | POA: Diagnosis not present

## 2020-11-24 DIAGNOSIS — M1991 Primary osteoarthritis, unspecified site: Secondary | ICD-10-CM | POA: Diagnosis not present

## 2021-01-11 ENCOUNTER — Ambulatory Visit: Payer: Self-pay | Admitting: Orthopedic Surgery

## 2021-01-13 ENCOUNTER — Ambulatory Visit: Payer: Self-pay | Admitting: Orthopedic Surgery

## 2021-01-13 NOTE — H&P (Signed)
Subjective:   Sean [name] is a very pleasant 50 year old gentleman who was in his usual state of health until he unfortunately had a work-related injury and now has significant neck and radicular right arm pain. Despite appropriate conservative care including OTC meds, injections, and PT the patient continues to have severe debilitating pain interfering with his quality of life. After discussing risks and bemefits patient has elected to move forward with surgical intervention. He is scheduled for ACDF C4-6 at [facility] on 01/20/21 with Dr. Rolena Infante.  Patient Active Problem List   Diagnosis Date Noted  . Carpal tunnel syndrome 08/16/2018  . Ureteral calculus, left 07/04/2015  . Renal colic on right side 51/74/2595  . Nephrolithiasis 03/25/2015  . COCCYGEAL PAIN 09/23/2007  . ANKLE SPRAIN, RIGHT 09/23/2007   Past Medical History:  Diagnosis Date  . Cancer [facility]) 2003   testicular  . Kidney stones     Past Surgical History:  Procedure Laterality Date  . CYSTOSCOPY W/ URETERAL STENT PLACEMENT Right 03/25/2015   Procedure: CYSTOSCOPY, RETROGRADE PYELOGRAM  WITH RIGHT URETERAL STENT PLACEMENT;  Surgeon: [name], MD;  [facility];  Service: Urology;  Laterality: Right;  . CYSTOSCOPY WITH RETROGRADE PYELOGRAM, URETEROSCOPY AND STENT PLACEMENT Left 07/04/2015   Procedure: CYSTOSCOPY WITH RETROGRADE PYELOGRAM, URETEROSCOPY AND STENT PLACEMENT;  Surgeon: [name], MD;  [facility];  Service: Urology;  Laterality: Left;  With Holmium Laser  . SURGERY SCROTAL / TESTICULAR  2003   for testicular cancer    Current Outpatient Medications  Medication Sig Dispense Refill Last Dose  . aspirin EC 81 MG tablet Take 81 mg by mouth 3 (three) times a week. Swallow whole.     Marland Kitchen Bioflavonoid Products (ESTER C PO) Take 1 tablet by mouth 2 (two) times daily as needed (immune support).     . diazepam (VALIUM) 5 MG tablet Take 5 mg by mouth at bedtime.     Marland Kitchen EPINEPHrine 0.3 mg/0.3 mL IJ SOAJ  injection Inject 0.3 mg into the muscle as needed for anaphylaxis.     . famotidine (PEPCID) 20 MG tablet Take 20 mg by mouth daily as needed for heartburn or indigestion.     Marland Kitchen HYDROcodone-acetaminophen (NORCO/VICODIN) 5-325 MG tablet Take 1 tablet by mouth every 4 (four) hours as needed. (Patient taking differently: Take 1 tablet by mouth every 4 (four) hours as needed for moderate pain.) 10 tablet 0   . ibuprofen (ADVIL,MOTRIN) 200 MG tablet Take 400-600 mg by mouth every 8 (eight) hours as needed for moderate pain.     Marland Kitchen loratadine (CLARITIN) 10 MG tablet Take 10 mg by mouth daily as needed for allergies.      No current facility-administered medications for this visit.   Allergies  Allergen Reactions  . Bee Venom Anaphylaxis  . Shellfish Allergy Anaphylaxis  . 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 Gabapentin     Weird dreams  . Hydromorphone Hcl Nausea And Vomiting    DILAUDID    Social History   Tobacco Use  . Smoking status: Former Research scientist (life sciences)  . Smokeless tobacco: Current User    Types: Chew  Substance Use Topics  . Alcohol use: No    No family history on file.  Review of Systems Pertinent items are noted in HPI.  Objective:   Vitals: Ht: 5 ft 9 in 01/13/2021 01:34 pm BP: 130/80 01/13/2021 01:38 pm Pulse: 103 bpm 01/13/2021 01:36 pm  Clinical  exam: Patient is alert and oriented 3. No shortness of breath or chest pain.  Abdomen: Soft and nontender, no rebound tenderness, no incontinence of bowel and bladder  Neck: Significant neck pain radiating into the left upper extremity. No shoulder, elbow, wrist pain with isolated joint range of motion. Positive occipital headaches.  Neuro: Positive numbness and dysesthesias in the left C5 and C6 dermatome. Positive Spurling sign with reproduction of neuropathic arm pain left upper extremity. 5/5 motor strength in the upper extremity bilaterally. Negative Hoffman test  X-rays of the cervical  spine demonstrate loss of normal cervical lordosis. No significant degenerative disease. No spondylolisthesis. No fracture.  Cervical MRI: completed on 09/16/20 was reviewed with the patient. It was completed at [facility]; I have independently reviewed the images as well as the radiology report. No cord signal changes. Mild to moderate degenerative disc height loss at C5-6 and C6-7. Moderate to severe foraminal narrowing right worse than the left C4-5 in C5-6. There is compression and irritation to the exiting right C5 and C6 nerve respectively. Loss of normal cervical lordosis.  Assessment:   Sean [name] is a very pleasant 50 year old gentleman who was in his usual state of health until he unfortunately had a work-related injury and now has significant neck and radicular arm pain. Imaging studies demonstrate moderate to severe foraminal narrowing at C4-5 and C5-6 producing irritation to the exiting C5 and C6 nerves. There is also encroachment on the dorsal root ganglion at these levels. At this point despite physical therapy and injection therapy his quality-of-life his continue to deteriorate. As a result he is expressed a desire to move forward with surgery.  Plan:    At this point I think the best option is a 2 level ACDF C4-6. I have gone over the surgery as well as the risks and benefits with the patient and his wife all their questions were encouraged and addressed. Risks and benefits of surgery were discussed with the patient. These include: Infection, bleeding, death, stroke, paralysis, ongoing or worse pain, need for additional surgery, nonunion, leak of spinal fluid, adjacent segment degeneration requiring additional fusion surgery. Pseudoarthrosis (nonunion)requiring supplemental posterior fixation. Throat pain, swallowing difficulties, hoarseness or change in voice.  We have obtained pre-op medical clearance from PCP.  Reviewed meds. No blood thinners, no ASA, no anti-inflammatories. No OTC  vitamins/supplements. Advised no ASA or anti-inflammatories prior to surgery.  We have also discussed the post-operative recovery period to include: bathing/showering restrictions, wound healing, activity (and driving) restrictions, medications/pain mangement.  We have also discussed post-operative redflags to include: signs and symptoms of postoperative infection, DVT/PE.  Questions are invited and answered.  Follow-up: As needed

## 2021-01-13 NOTE — H&P (Deleted)
  The note originally documented on this encounter has been moved the the encounter in which it belongs.  

## 2021-01-17 ENCOUNTER — Encounter (HOSPITAL_COMMUNITY): Payer: Self-pay

## 2021-01-17 NOTE — Progress Notes (Signed)
Surgical Instructions    Your procedure is scheduled on Thursday 01/20/2021.  Report to [facility] Main Entrance "A" at 12:15 P.M., then check in with the Admitting office.   Call this number if you have problems the morning of surgery:  [phone]    If you have any questions prior to your surgery date call [phone]: Open Monday-Friday 8am-4pm    Remember:  Do not eat after midnight the night before your surgery  You may drink clear liquids until 11:12 the morning of your surgery.   Clear liquids allowed are: Water, Non-Citrus Juices (without pulp), Carbonated Beverages, Clear Tea, Black Coffee Only, and Gatorade    Take these medicines the morning of surgery IF NEEDED with A SIP OF WATER: Famotidine (Pepcid) Hydrocodone-acetaminophen (Norco/Vicodin) Loratadine (Claritin)  As of today, STOP taking any Aspirin or aspirin-containing products (unless otherwise instructed by your surgeon), Aleve, Naproxen, Ibuprofen, Motrin, Advil, Goody's, BC's, all herbal medications, supplements, fish oil, and all vitamins.          Do NOT Smoke (Tobacco/Vaping) or drink Alcohol 24 hours prior to your procedure  If you use a CPAP at night, you may bring all equipment for your overnight stay.   Contacts, glasses, hearing aids, dentures or partials may not be worn into surgery, please bring cases for these belongings   For patients admitted to the hospital, discharge time will be determined by your treatment team.   Patients discharged the day of surgery will not be allowed to drive home, and someone needs to stay with them for 24 hours.    Special instructions:   [facility]- Preparing For Surgery  Before surgery, you can play an important role. Because skin is not sterile, your skin needs to be as free of germs as possible. You can reduce the number of germs on your skin by washing with CHG (chlorahexidine gluconate) Soap before surgery.  CHG is an antiseptic cleaner which kills germs and  bonds with the skin to continue killing germs even after washing.    Oral Hygiene is also important to reduce your risk of infection.  Remember - BRUSH YOUR TEETH THE MORNING OF SURGERY WITH YOUR REGULAR TOOTHPASTE  Please do not use if you have an allergy to CHG or antibacterial soaps. If your skin becomes reddened/irritated stop using the CHG.  Do not shave (including legs and underarms) for at least 48 hours prior to first CHG shower. It is OK to shave your face.  Please follow these instructions carefully.   You are going to shower with CHG soap 2 different times.  The NIGHT BEFORE SURGERY/PROCEDURE and then again the MORNING OF SURGERY/PROCEDURE   1. If you chose to wash your hair, wash your hair first as usual with your normal shampoo.  2. After you shampoo, rinse your hair and body thoroughly to remove the shampoo.  3. Wash Face and genitals (private parts) with your normal soap.   4. THEN Shower with CHG Soap.   5. Use CHG as you would any other liquid soap. You can apply CHG directly to the skin and wash gently with a pouf/sponge or a clean washcloth.   6. Apply the CHG Soap to your body ONLY FROM THE NECK DOWN.  Do not use on open wounds or open sores. Avoid contact with your eyes, ears, mouth and genitals (private parts). Wash Face and genitals (private parts)  with your normal soap.   7. Wash thoroughly, paying special attention to the area where your  surgery will be performed.  8. Thoroughly rinse your body with warm water from the neck down.  9. DO NOT shower/wash with your normal soap after using and rinsing off the CHG Soap.  10. Pat yourself dry with a CLEAN TOWEL.  11. Wear CLEAN PAJAMAS to bed the night before surgery  12. Place CLEAN SHEETS on your bed the night before your surgery  13. DO NOT SLEEP WITH PETS.   Day of Surgery: Shower with CHG soap as directed.  Do not shave 48 hours prior to surgery.  Men may shave face and neck.  Do not wear lotions,  powders, perfumes/colognes, or deodorant.  Wear Clean/Comfortable clothing the morning of surgery  Remember to brush your teeth WITH YOUR REGULAR TOOTHPASTE.             Do not wear jewelry, make up, or nail polish  Do not bring valuables to the hospital.             [facility] is not responsible for any belongings or valuables.    Please read over the following fact sheets that you were given.

## 2021-01-18 ENCOUNTER — Other Ambulatory Visit (HOSPITAL_COMMUNITY)
Admission: RE | Admit: 2021-01-18 | Discharge: 2021-01-18 | Disposition: A | Discharge status: Home / Self Care | Payer: Worker's Compensation | Source: Ambulatory Visit | Attending: Orthopedic Surgery | Admitting: Orthopedic Surgery

## 2021-01-18 ENCOUNTER — Ambulatory Visit (HOSPITAL_COMMUNITY)
Admission: RE | Admit: 2021-01-18 | Discharge: 2021-01-18 | Disposition: A | Discharge status: Home / Self Care | Payer: Worker's Compensation | Source: Ambulatory Visit | Attending: Orthopedic Surgery | Admitting: Orthopedic Surgery

## 2021-01-18 ENCOUNTER — Encounter (HOSPITAL_COMMUNITY): Payer: Self-pay

## 2021-01-18 ENCOUNTER — Encounter (HOSPITAL_COMMUNITY)
Admission: RE | Admit: 2021-01-18 | Discharge: 2021-01-18 | Disposition: A | Discharge status: Home / Self Care | Payer: Worker's Compensation | Source: Ambulatory Visit | Attending: Orthopedic Surgery | Admitting: Orthopedic Surgery

## 2021-01-18 ENCOUNTER — Other Ambulatory Visit: Payer: Self-pay

## 2021-01-18 ENCOUNTER — Ambulatory Visit: Payer: Self-pay | Admitting: Orthopedic Surgery

## 2021-01-18 DIAGNOSIS — Z20822 Contact with and (suspected) exposure to covid-19: Secondary | ICD-10-CM | POA: Insufficient documentation

## 2021-01-18 DIAGNOSIS — Z01818 Encounter for other preprocedural examination: Secondary | ICD-10-CM | POA: Diagnosis present

## 2021-01-18 DIAGNOSIS — Z01811 Encounter for preprocedural respiratory examination: Secondary | ICD-10-CM

## 2021-01-18 DIAGNOSIS — Z01812 Encounter for preprocedural laboratory examination: Secondary | ICD-10-CM | POA: Insufficient documentation

## 2021-01-18 HISTORY — DX: Personal history of urinary calculi: Z87.442

## 2021-01-18 HISTORY — DX: Sleep apnea, unspecified: G47.30

## 2021-01-18 LAB — SARS CORONAVIRUS 2 (TAT 6-24 HRS): SARS Coronavirus 2: NEGATIVE

## 2021-01-18 LAB — CBC
HCT: 48.1 % (ref 39.0–52.0)
Hemoglobin: 16.4 g/dL (ref 13.0–17.0)
MCH: 29.9 pg (ref 26.0–34.0)
MCHC: 34.1 g/dL (ref 30.0–36.0)
MCV: 87.6 fL (ref 80.0–100.0)
Platelets: 224 10*3/uL (ref 150–400)
RBC: 5.49 MIL/uL (ref 4.22–5.81)
RDW: 13.2 % (ref 11.5–15.5)
WBC: 5.9 10*3/uL (ref 4.0–10.5)
nRBC: 0 % (ref 0.0–0.2)

## 2021-01-18 LAB — BASIC METABOLIC PANEL
Anion gap: 9 (ref 5–15)
BUN: 7 mg/dL (ref 6–20)
CO2: 25 mmol/L (ref 22–32)
Calcium: 9 mg/dL (ref 8.9–10.3)
Chloride: 104 mmol/L (ref 98–111)
Creatinine, Ser: 0.84 mg/dL (ref 0.61–1.24)
GFR, Estimated: >60 mL/min (ref >60)
Glucose, Bld: 101 mg/dL — ABNORMAL HIGH (ref 70–99)
Potassium: 3.4 mmol/L — ABNORMAL LOW (ref 3.5–5.1)
Sodium: 138 mmol/L (ref 135–145)

## 2021-01-18 LAB — URINALYSIS, ROUTINE W REFLEX MICROSCOPIC
Bilirubin Urine: NEGATIVE
Glucose, UA: NEGATIVE mg/dL
Hgb urine dipstick: NEGATIVE
Ketones, ur: NEGATIVE mg/dL
Leukocytes,Ua: NEGATIVE
Nitrite: NEGATIVE
Protein, ur: NEGATIVE mg/dL
Specific Gravity, Urine: 1.018 (ref 1.005–1.030)
pH: 5 (ref 5.0–8.0)

## 2021-01-18 LAB — SURGICAL PCR SCREEN
MRSA, PCR: NEGATIVE
Staphylococcus aureus: NEGATIVE

## 2021-01-18 NOTE — Progress Notes (Addendum)
PCP - [name] Cardiologist - patient denies  PPM/ICD - n/a Device Orders -  Rep Notified -   Chest x-ray - n/a EKG - 09/15/2020 Stress Test - patient denies ECHO - 2011 Cardiac Cath - patient denies  Sleep Study - had a sleep study approx. 6 years ago.  Patient states OSA so mild they did not put him on CPAP CPAP -   Fasting Blood Sugar - n/a Checks Blood Sugar _____ times a day  Blood Thinner Instructions: n/a Aspirin Instructions: last dose was 01/12/21  ERAS Protcol - clears until 1112 PRE-SURGERY Ensure or G2- n/a  COVID TEST- after PAT appointment   Anesthesia review: n/a  Patient denies shortness of breath, fever, cough and chest pain at PAT appointment   All instructions explained to the patient, with a verbal understanding of the material. Patient agrees to go over the instructions while at home for a better understanding. Patient also instructed to self quarantine after being tested for COVID-19. The opportunity to ask questions was provided.

## 2021-01-18 NOTE — Progress Notes (Addendum)
Anesthesia Chart Review:  Case: [id number] Date/Time: 01/20/21 1403   Procedure: ANTERIOR CERVICAL DECOMPRESSION/DISCECTOMY FUSION 2 LEVELS C4-6 (N/A ) - 3 hrs   Anesthesia type: General   Pre-op diagnosis: Cervical spondyloitic radiculopathy   Location: [facility] OR ROOM 04 / [name]   Surgeons: [name], MD      DISCUSSION: Patient is a 50 year old male scheduled for the above procedure.  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 BMI is consistent with morbid obesity.  Last ASA 01/12/21.   01/19/20 presurgical COVID-19 test negative. PT/PTT sample hemolyzed, so to be redrawn on the day of surgery.  Anesthesia team to evaluate on the day of surgery.   VS: BP 118/82   Pulse 84   Temp 36.6 C (Oral)   Resp 19   Ht 5\' 9"  (1.753 m)   Wt 126.1 kg   SpO2 99%   BMI 41.04 kg/m    PROVIDERS: [name], MD his PCP   LABS: Labs reviewed: Acceptable for surgery. See DISCUSSION. (all labs ordered are listed, but only abnormal results are displayed)  Labs Reviewed  BASIC METABOLIC PANEL - Abnormal; Notable for the following components:      Result Value   Potassium 3.4 (*)    Glucose, Bld 101 (*)    All other components within normal limits  SURGICAL PCR SCREEN  CBC  URINALYSIS, ROUTINE W REFLEX MICROSCOPIC    Sleep Study 05/25/16: IMPRESSIONS - Mild obstructive sleep apnea. - No significant central sleep apnea occurred during this study. 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  - Reduced sleep efficiency.    IMAGES: CXR 01/18/21:  FINDINGS: The heart size and mediastinal contours are normal. The lungs are clear. There is no pleural effusion or pneumothorax. No acute osseous findings are identified. There are surgical clips in the right upper quadrant of the abdomen. IMPRESSION: No active cardiopulmonary  process.   EKG: 09/15/20: Sinus rhythm Baseline wander in lead(s) V3 Confirmed by Gerlene Fee [phone]) on 09/15/2020 2:49:32 PM   CV: Stress echo 08/16/10: Study Conclusions  - Peak stress: Normal reduction in LVEDD with increase in LVEF to   65-70%. Definity contrast given to improve wall visualization.   There are no obvious stress induced wall motion abnormalities.  - Baseline: LVEF 55-60%. No rest wall motion abnormalities.  - Stress: Baseline EKG: Sinus Rhythm at 65. Stress EKG: Sinus   tachycardia with a few PVC's. No stress induced ischemic EKG   changes.    Past Medical History:  Diagnosis Date  . Cancer [facility]) 2003   testicular  . History of kidney stones   . Sleep apnea    pt states it was very mild and does not have to wear CPAP    Past Surgical History:  Procedure Laterality Date  . CYSTOSCOPY W/ URETERAL STENT PLACEMENT Right 03/25/2015   Procedure: CYSTOSCOPY, RETROGRADE PYELOGRAM  WITH RIGHT URETERAL STENT PLACEMENT;  Surgeon: [name], MD;  Location: [facility] ORS;  Service: Urology;  Laterality: Right;  . CYSTOSCOPY WITH RETROGRADE PYELOGRAM, URETEROSCOPY AND STENT PLACEMENT Left 07/04/2015   Procedure: CYSTOSCOPY WITH RETROGRADE PYELOGRAM, URETEROSCOPY AND STENT PLACEMENT;  Surgeon: [name], MD;  Location: [facility] ORS;  Service: Urology;  Laterality: Left;  With Holmium Laser  . SURGERY SCROTAL / TESTICULAR  2003   for testicular cancer    MEDICATIONS: . aspirin EC 81 MG tablet  . Bioflavonoid  Products (ESTER C PO)  . diazepam (VALIUM) 5 MG tablet  . EPINEPHrine 0.3 mg/0.3 mL IJ SOAJ injection  . famotidine (PEPCID) 20 MG tablet  . HYDROcodone-acetaminophen (NORCO/VICODIN) 5-325 MG tablet  . ibuprofen (ADVIL,MOTRIN) 200 MG tablet  . loratadine (CLARITIN) 10 MG tablet   No current facility-administered medications for this encounter.    [name], PA-C Surgical Short Stay/Anesthesiology [facility] Phone [phone] [facility] Phone [phone] 01/19/2021 8:50 AM

## 2021-01-18 NOTE — Anesthesia Preprocedure Evaluation (Addendum)
Anesthesia Evaluation  Patient identified by MRN, date of birth, ID band Patient awake    Reviewed: Allergy & Precautions, NPO status , Patient's Chart, lab work & pertinent test results  Airway Mallampati: IV  TM Distance: >3 FB Neck ROM: Full    Dental no notable dental hx. (+) Dental Advisory Given   Pulmonary sleep apnea , former smoker,    Pulmonary exam normal breath sounds clear to auscultation       Cardiovascular negative cardio ROS Normal cardiovascular exam Rhythm:Regular Rate:Normal  ECG: SR, rate 72   Neuro/Psych  Neuromuscular disease negative psych ROS   GI/Hepatic negative GI ROS, Neg liver ROS,   Endo/Other  Morbid obesity  Renal/GU negative Renal ROS     Musculoskeletal negative musculoskeletal ROS (+)   Abdominal (+) + obese,   Peds  Hematology negative hematology ROS (+)   Anesthesia Other Findings Cervical spondyloitic radiculopathy  Reproductive/Obstetrics                          Anesthesia Physical Anesthesia Plan  ASA: III  Anesthesia Plan: General   Post-op Pain Management:    Induction: Intravenous  PONV Risk Score and Plan: 2 and Ondansetron, Dexamethasone, Midazolam, Treatment may vary due to age or medical condition and Scopolamine patch - Pre-op  Airway Management Planned: Oral ETT and Video Laryngoscope Planned  Additional Equipment:   Intra-op Plan:   Post-operative Plan: Extubation in OR  Informed Consent: I have reviewed the patients History and Physical, chart, labs and discussed the procedure including the risks, benefits and alternatives for the proposed anesthesia with the patient or authorized representative who has indicated his/her understanding and acceptance.     Dental advisory given  Plan Discussed with: CRNA  Anesthesia Plan Comments: (Reviewed PAT note written by [name], PA-C. )      Anesthesia Quick  Evaluation

## 2021-01-18 NOTE — Progress Notes (Signed)
Received call from main lab stating that previous sample for PT-INR and APTT that was collected at PAT appointment hemolyzed and needs to be recollected DOS.  New orders placed.

## 2021-01-19 MED ORDER — DEXTROSE 5 % IV SOLN
3.0000 g | INTRAVENOUS | Status: DC
  Filled 2021-01-19: qty 3000

## 2021-01-20 ENCOUNTER — Encounter (HOSPITAL_COMMUNITY)
Admission: RE | Disposition: A | Discharge status: Home / Self Care | Payer: Self-pay | Source: Home / Self Care | Attending: Orthopedic Surgery

## 2021-01-20 ENCOUNTER — Ambulatory Visit (HOSPITAL_COMMUNITY): Payer: Worker's Compensation | Admitting: Anesthesiology

## 2021-01-20 ENCOUNTER — Other Ambulatory Visit: Payer: Self-pay

## 2021-01-20 ENCOUNTER — Ambulatory Visit (HOSPITAL_COMMUNITY): Payer: Worker's Compensation | Admitting: Vascular Surgery

## 2021-01-20 ENCOUNTER — Observation Stay (HOSPITAL_COMMUNITY)
Admission: RE | Admit: 2021-01-20 | Discharge: 2021-01-21 | Disposition: A | Discharge status: Home / Self Care | Payer: Worker's Compensation | Attending: Orthopedic Surgery | Admitting: Orthopedic Surgery

## 2021-01-20 ENCOUNTER — Ambulatory Visit (HOSPITAL_COMMUNITY): Payer: Worker's Compensation

## 2021-01-20 ENCOUNTER — Encounter (HOSPITAL_COMMUNITY): Payer: Self-pay | Admitting: Orthopedic Surgery

## 2021-01-20 DIAGNOSIS — G4730 Sleep apnea, unspecified: Secondary | ICD-10-CM | POA: Diagnosis not present

## 2021-01-20 DIAGNOSIS — Z791 Long term (current) use of non-steroidal anti-inflammatories (NSAID): Secondary | ICD-10-CM | POA: Diagnosis not present

## 2021-01-20 DIAGNOSIS — M5021 Other cervical disc displacement,  high cervical region: Secondary | ICD-10-CM | POA: Diagnosis not present

## 2021-01-20 DIAGNOSIS — M5020 Other cervical disc displacement, unspecified cervical region: Secondary | ICD-10-CM | POA: Diagnosis present

## 2021-01-20 DIAGNOSIS — Z419 Encounter for procedure for purposes other than remedying health state, unspecified: Secondary | ICD-10-CM

## 2021-01-20 DIAGNOSIS — M549 Dorsalgia, unspecified: Secondary | ICD-10-CM | POA: Diagnosis present

## 2021-01-20 DIAGNOSIS — Z87891 Personal history of nicotine dependence: Secondary | ICD-10-CM | POA: Insufficient documentation

## 2021-01-20 DIAGNOSIS — Z8547 Personal history of malignant neoplasm of testis: Secondary | ICD-10-CM | POA: Diagnosis not present

## 2021-01-20 HISTORY — PX: ANTERIOR CERVICAL DECOMP/DISCECTOMY FUSION: SHX1161

## 2021-01-20 LAB — PROTIME-INR
INR: 1.1 (ref 0.8–1.2)
Prothrombin Time: 13.3 seconds (ref 11.4–15.2)

## 2021-01-20 LAB — APTT: aPTT: 31 seconds (ref 24–36)

## 2021-01-20 SURGERY — ANTERIOR CERVICAL DECOMPRESSION/DISCECTOMY FUSION 2 LEVELS
Anesthesia: General | Site: Spine Cervical

## 2021-01-20 MED ORDER — CEFAZOLIN SODIUM-DEXTROSE 1-4 GM/50ML-% IV SOLN
1.0000 g | Freq: Three times a day (TID) | INTRAVENOUS | Status: AC
  Administered 2021-01-20 – 2021-01-21 (×2): 1 g via INTRAVENOUS
  Filled 2021-01-20 (×2): qty 50

## 2021-01-20 MED ORDER — SODIUM CHLORIDE 0.9 % IV SOLN: 250.0000 mL | INTRAVENOUS | Status: DC

## 2021-01-20 MED ORDER — METHOCARBAMOL 1000 MG/10ML IJ SOLN
500.0000 mg | Freq: Four times a day (QID) | INTRAVENOUS | Status: DC | PRN
  Filled 2021-01-20: qty 5

## 2021-01-20 MED ORDER — ACETAMINOPHEN 325 MG PO TABS
650.0000 mg | ORAL_TABLET | ORAL | Status: DC | PRN
  Administered 2021-01-20 – 2021-01-21 (×3): 650 mg via ORAL
  Filled 2021-01-20 (×3): qty 2

## 2021-01-20 MED ORDER — PHENOL 1.4 % MT LIQD
1.0000 | OROMUCOSAL | Status: DC | PRN
  Filled 2021-01-20: qty 177

## 2021-01-20 MED ORDER — FENTANYL CITRATE (PF) 100 MCG/2ML IJ SOLN
INTRAMUSCULAR | Status: AC
  Filled 2021-01-20: qty 2

## 2021-01-20 MED ORDER — ROCURONIUM BROMIDE 10 MG/ML (PF) SYRINGE
PREFILLED_SYRINGE | INTRAVENOUS | Status: AC
  Filled 2021-01-20: qty 20

## 2021-01-20 MED ORDER — PROMETHAZINE HCL 25 MG/ML IJ SOLN: 6.2500 mg | INTRAMUSCULAR | Status: DC | PRN

## 2021-01-20 MED ORDER — BUPIVACAINE-EPINEPHRINE (PF) 0.25% -1:200000 IJ SOLN
INTRAMUSCULAR | Status: AC
  Filled 2021-01-20: qty 30

## 2021-01-20 MED ORDER — ACETAMINOPHEN 10 MG/ML IV SOLN
INTRAVENOUS | Status: DC | PRN
  Administered 2021-01-20: 1000 mg via INTRAVENOUS

## 2021-01-20 MED ORDER — SODIUM CHLORIDE 0.9% FLUSH: 3.0000 mL | Freq: Two times a day (BID) | INTRAVENOUS | Status: DC

## 2021-01-20 MED ORDER — FLEET ENEMA 7-19 GM/118ML RE ENEM: 1.0000 | ENEMA | Freq: Once | RECTAL | Status: DC | PRN

## 2021-01-20 MED ORDER — ONDANSETRON HCL 4 MG PO TABS
4.0000 mg | ORAL_TABLET | Freq: Four times a day (QID) | ORAL | Status: DC | PRN
  Administered 2021-01-20 – 2021-01-21 (×2): 4 mg via ORAL
  Filled 2021-01-20 (×2): qty 1

## 2021-01-20 MED ORDER — KETAMINE HCL 50 MG/5ML IJ SOSY
PREFILLED_SYRINGE | INTRAMUSCULAR | Status: AC
  Filled 2021-01-20: qty 5

## 2021-01-20 MED ORDER — ACETAMINOPHEN 650 MG RE SUPP: 650.0000 mg | RECTAL | Status: DC | PRN

## 2021-01-20 MED ORDER — OXYCODONE-ACETAMINOPHEN 10-325 MG PO TABS: 1.0000 | ORAL_TABLET | Freq: Four times a day (QID) | ORAL | 0 refills | Status: AC | PRN

## 2021-01-20 MED ORDER — POLYETHYLENE GLYCOL 3350 17 G PO PACK
17.0000 g | PACK | Freq: Every day | ORAL | Status: DC | PRN
Start: 2021-01-20 — End: 2021-01-21

## 2021-01-20 MED ORDER — KETAMINE HCL 10 MG/ML IJ SOLN
INTRAMUSCULAR | Status: DC | PRN
  Administered 2021-01-20: 40 mg via INTRAVENOUS
  Administered 2021-01-20: 10 mg via INTRAVENOUS

## 2021-01-20 MED ORDER — METHOCARBAMOL 500 MG PO TABS
500.0000 mg | ORAL_TABLET | Freq: Four times a day (QID) | ORAL | Status: DC | PRN
  Administered 2021-01-20 – 2021-01-21 (×3): 500 mg via ORAL
  Filled 2021-01-20 (×2): qty 1

## 2021-01-20 MED ORDER — ROCURONIUM BROMIDE 10 MG/ML (PF) SYRINGE
PREFILLED_SYRINGE | INTRAVENOUS | Status: DC | PRN
  Administered 2021-01-20: 10 mg via INTRAVENOUS
  Administered 2021-01-20: 100 mg via INTRAVENOUS
  Administered 2021-01-20: 30 mg via INTRAVENOUS

## 2021-01-20 MED ORDER — PROPOFOL 10 MG/ML IV BOLUS
INTRAVENOUS | Status: DC | PRN
  Administered 2021-01-20: 200 mg via INTRAVENOUS

## 2021-01-20 MED ORDER — ONDANSETRON HCL 4 MG/2ML IJ SOLN: 4.0000 mg | Freq: Four times a day (QID) | INTRAMUSCULAR | Status: DC | PRN

## 2021-01-20 MED ORDER — OXYCODONE HCL 5 MG PO TABS
10.0000 mg | ORAL_TABLET | ORAL | Status: DC | PRN
  Administered 2021-01-20 – 2021-01-21 (×5): 10 mg via ORAL
  Filled 2021-01-20 (×4): qty 2

## 2021-01-20 MED ORDER — ORAL CARE MOUTH RINSE: 15.0000 mL | Freq: Once | OROMUCOSAL | Status: AC

## 2021-01-20 MED ORDER — PHENYLEPHRINE HCL (PRESSORS) 10 MG/ML IV SOLN
INTRAVENOUS | Status: AC
  Filled 2021-01-20: qty 1

## 2021-01-20 MED ORDER — OXYCODONE HCL 5 MG/5ML PO SOLN
5.0000 mg | Freq: Once | ORAL | Status: DC | PRN
Start: 2021-01-20 — End: 2021-01-20

## 2021-01-20 MED ORDER — CHLORHEXIDINE GLUCONATE 0.12 % MT SOLN: 15.0000 mL | Freq: Once | OROMUCOSAL | Status: AC

## 2021-01-20 MED ORDER — LIDOCAINE 2% (20 MG/ML) 5 ML SYRINGE
INTRAMUSCULAR | Status: AC
  Filled 2021-01-20: qty 5

## 2021-01-20 MED ORDER — METHOCARBAMOL 500 MG PO TABS: 500.0000 mg | ORAL_TABLET | Freq: Three times a day (TID) | ORAL | 0 refills | Status: AC | PRN

## 2021-01-20 MED ORDER — DEXAMETHASONE SODIUM PHOSPHATE 10 MG/ML IJ SOLN
INTRAMUSCULAR | Status: DC | PRN
  Administered 2021-01-20: 10 mg via INTRAVENOUS

## 2021-01-20 MED ORDER — ONDANSETRON HCL 4 MG/2ML IJ SOLN
INTRAMUSCULAR | Status: DC | PRN
  Administered 2021-01-20: 4 mg via INTRAVENOUS

## 2021-01-20 MED ORDER — THROMBIN 20000 UNITS EX SOLR
CUTANEOUS | Status: DC | PRN
  Administered 2021-01-20: 20 mL via TOPICAL

## 2021-01-20 MED ORDER — PHENYLEPHRINE 40 MCG/ML (10ML) SYRINGE FOR IV PUSH (FOR BLOOD PRESSURE SUPPORT)
PREFILLED_SYRINGE | INTRAVENOUS | Status: DC | PRN
  Administered 2021-01-20: 120 ug via INTRAVENOUS
  Administered 2021-01-20: 80 ug via INTRAVENOUS
  Administered 2021-01-20: 120 ug via INTRAVENOUS
  Administered 2021-01-20: 80 ug via INTRAVENOUS

## 2021-01-20 MED ORDER — 0.9 % SODIUM CHLORIDE (POUR BTL) OPTIME
TOPICAL | Status: DC | PRN
  Administered 2021-01-20: 1000 mL

## 2021-01-20 MED ORDER — LIDOCAINE 2% (20 MG/ML) 5 ML SYRINGE
INTRAMUSCULAR | Status: DC | PRN
  Administered 2021-01-20: 60 mg via INTRAVENOUS

## 2021-01-20 MED ORDER — CHLORHEXIDINE GLUCONATE 0.12 % MT SOLN
OROMUCOSAL | Status: AC
  Administered 2021-01-20: 15 mL via OROMUCOSAL
  Filled 2021-01-20: qty 15

## 2021-01-20 MED ORDER — ONDANSETRON HCL 4 MG PO TABS: 4.0000 mg | ORAL_TABLET | Freq: Three times a day (TID) | ORAL | 0 refills | Status: DC | PRN

## 2021-01-20 MED ORDER — TRANEXAMIC ACID-NACL 1000-0.7 MG/100ML-% IV SOLN
INTRAVENOUS | Status: AC
  Filled 2021-01-20: qty 100

## 2021-01-20 MED ORDER — FENTANYL CITRATE (PF) 100 MCG/2ML IJ SOLN
25.0000 ug | INTRAMUSCULAR | Status: DC | PRN
Start: 2021-01-20 — End: 2021-01-20
  Administered 2021-01-20 (×3): 50 ug via INTRAVENOUS

## 2021-01-20 MED ORDER — FENTANYL CITRATE (PF) 250 MCG/5ML IJ SOLN
INTRAMUSCULAR | Status: AC
  Filled 2021-01-20: qty 5

## 2021-01-20 MED ORDER — SUGAMMADEX SODIUM 200 MG/2ML IV SOLN
INTRAVENOUS | Status: DC | PRN
  Administered 2021-01-20: 400 mg via INTRAVENOUS

## 2021-01-20 MED ORDER — BUPIVACAINE-EPINEPHRINE 0.25% -1:200000 IJ SOLN
INTRAMUSCULAR | Status: DC | PRN
  Administered 2021-01-20: 10 mL

## 2021-01-20 MED ORDER — ACETAMINOPHEN 10 MG/ML IV SOLN
INTRAVENOUS | Status: AC
  Filled 2021-01-20: qty 100

## 2021-01-20 MED ORDER — ACETAMINOPHEN 10 MG/ML IV SOLN: 1000.0000 mg | Freq: Once | INTRAVENOUS | Status: DC | PRN

## 2021-01-20 MED ORDER — METHOCARBAMOL 500 MG PO TABS
ORAL_TABLET | ORAL | Status: AC
  Filled 2021-01-20: qty 1

## 2021-01-20 MED ORDER — HYDROMORPHONE HCL 1 MG/ML IJ SOLN
0.5000 mg | INTRAMUSCULAR | Status: DC | PRN
  Filled 2021-01-20: qty 0.5

## 2021-01-20 MED ORDER — OXYCODONE HCL 5 MG PO TABS
5.0000 mg | ORAL_TABLET | ORAL | Status: DC | PRN
Start: 2021-01-20 — End: 2021-01-21

## 2021-01-20 MED ORDER — LACTATED RINGERS IV SOLN: INTRAVENOUS | Status: DC

## 2021-01-20 MED ORDER — OXYCODONE HCL 5 MG PO TABS
5.0000 mg | ORAL_TABLET | Freq: Once | ORAL | Status: DC | PRN
Start: 2021-01-20 — End: 2021-01-20

## 2021-01-20 MED ORDER — TRANEXAMIC ACID-NACL 1000-0.7 MG/100ML-% IV SOLN
INTRAVENOUS | Status: DC | PRN
  Administered 2021-01-20: 1000 mg via INTRAVENOUS

## 2021-01-20 MED ORDER — PROPOFOL 10 MG/ML IV BOLUS
INTRAVENOUS | Status: AC
  Filled 2021-01-20: qty 40

## 2021-01-20 MED ORDER — OXYCODONE HCL 5 MG PO TABS
ORAL_TABLET | ORAL | Status: AC
  Filled 2021-01-20: qty 2

## 2021-01-20 MED ORDER — MIDAZOLAM HCL 5 MG/5ML IJ SOLN
INTRAMUSCULAR | Status: DC | PRN
  Administered 2021-01-20: 2 mg via INTRAVENOUS

## 2021-01-20 MED ORDER — FENTANYL CITRATE (PF) 250 MCG/5ML IJ SOLN
INTRAMUSCULAR | Status: DC | PRN
  Administered 2021-01-20: 250 ug via INTRAVENOUS

## 2021-01-20 MED ORDER — MENTHOL 3 MG MT LOZG: 1.0000 | LOZENGE | OROMUCOSAL | Status: DC | PRN

## 2021-01-20 MED ORDER — MIDAZOLAM HCL 2 MG/2ML IJ SOLN
INTRAMUSCULAR | Status: AC
  Filled 2021-01-20: qty 2

## 2021-01-20 MED ORDER — THROMBIN 20000 UNITS EX SOLR
CUTANEOUS | Status: AC
  Filled 2021-01-20: qty 20000

## 2021-01-20 MED ORDER — SODIUM CHLORIDE 0.9% FLUSH: 3.0000 mL | INTRAVENOUS | Status: DC | PRN

## 2021-01-20 SURGICAL SUPPLY — 64 items
BLADE CLIPPER SURG (BLADE) IMPLANT
BONE VIVIGEN FORMABLE 5.4CC (Bone Implant) ×2 IMPLANT
BUR EGG ELITE 4.0 (BURR) IMPLANT
BUR MATCHSTICK NEURO 3.0 LAGG (BURR) IMPLANT
CABLE BIPOLOR RESECTION CORD (MISCELLANEOUS) ×2 IMPLANT
CANISTER SUCT 3000ML PPV (MISCELLANEOUS) ×2 IMPLANT
CLSR STERI-STRIP ANTIMIC 1/2X4 (GAUZE/BANDAGES/DRESSINGS) ×2 IMPLANT
COVER MAYO STAND STRL (DRAPES) ×6 IMPLANT
COVER SURGICAL LIGHT HANDLE (MISCELLANEOUS) ×4 IMPLANT
COVER WAND RF STERILE (DRAPES) ×2 IMPLANT
DEVICE EDNSKLTN TC NNLCK MED 8 (Cage) ×1 IMPLANT
DEVICE ENDSKLTN IMPL 16X14X7X6 (Cage) ×1 IMPLANT
DRAPE C-ARM 42X72 X-RAY (DRAPES) ×2 IMPLANT
DRAPE POUCH INSTRU U-SHP 10X18 (DRAPES) ×2 IMPLANT
DRAPE SURG 17X23 STRL (DRAPES) ×2 IMPLANT
DRAPE U-SHAPE 47X51 STRL (DRAPES) ×2 IMPLANT
DRSG OPSITE POSTOP 3X4 (GAUZE/BANDAGES/DRESSINGS) ×2 IMPLANT
DRSG OPSITE POSTOP 4X6 (GAUZE/BANDAGES/DRESSINGS) ×2 IMPLANT
DURAPREP 26ML APPLICATOR (WOUND CARE) ×2 IMPLANT
ELECT COATED BLADE 2.86 ST (ELECTRODE) ×2 IMPLANT
ELECT PENCIL ROCKER SW 15FT (MISCELLANEOUS) ×2 IMPLANT
ELECT REM PT RETURN 9FT ADLT (ELECTROSURGICAL) ×2
ELECTRODE REM PT RTRN 9FT ADLT (ELECTROSURGICAL) ×1 IMPLANT
ENDOSKELETON IMPLANT 16X14X7X6 (Cage) ×2 IMPLANT
ENDOSKELTON IMPLANT TC MED 8MM (Cage) ×2 IMPLANT
GLOVE BIO SURGEON STRL SZ 6.5 (GLOVE) ×2 IMPLANT
GLOVE BIOGEL PI IND STRL 8.5 (GLOVE) ×1 IMPLANT
GLOVE BIOGEL PI INDICATOR 8.5 (GLOVE) ×1
GLOVE SS BIOGEL STRL SZ 8.5 (GLOVE) ×1 IMPLANT
GLOVE SUPERSENSE BIOGEL SZ 8.5 (GLOVE) ×1
GLOVE SURG UNDER POLY LF SZ6.5 (GLOVE) ×2 IMPLANT
GOWN STRL REUS W/ TWL LRG LVL3 (GOWN DISPOSABLE) ×1 IMPLANT
GOWN STRL REUS W/TWL 2XL LVL3 (GOWN DISPOSABLE) ×2 IMPLANT
GOWN STRL REUS W/TWL LRG LVL3 (GOWN DISPOSABLE) ×2
KIT BASIN OR (CUSTOM PROCEDURE TRAY) ×2 IMPLANT
KIT TURNOVER KIT B (KITS) ×2 IMPLANT
NEEDLE HYPO 22GX1.5 SAFETY (NEEDLE) ×2 IMPLANT
NEEDLE SPNL 18GX3.5 QUINCKE PK (NEEDLE) ×2 IMPLANT
NS IRRIG 1000ML POUR BTL (IV SOLUTION) ×2 IMPLANT
PACK ORTHO CERVICAL (CUSTOM PROCEDURE TRAY) ×2 IMPLANT
PACK UNIVERSAL I (CUSTOM PROCEDURE TRAY) ×2 IMPLANT
PAD ARMBOARD 7.5X6 YLW CONV (MISCELLANEOUS) ×4 IMPLANT
PATTIES SURGICAL .25X.25 (GAUZE/BANDAGES/DRESSINGS) ×2 IMPLANT
PIN DISTRACTION MAXCESS-C 14 (PIN) ×4 IMPLANT
PLATE ACP 1.6X38 2LVL (Plate) ×2 IMPLANT
POSITIONER HEAD DONUT 9IN (MISCELLANEOUS) ×2 IMPLANT
RESTRAINT LIMB HOLDER UNIV (RESTRAINTS) ×2 IMPLANT
SCREW ACP VA SD 3.5X15 (Screw) ×12 IMPLANT
SPONGE INTESTINAL PEANUT (DISPOSABLE) ×2 IMPLANT
SPONGE LAP 4X18 RFD (DISPOSABLE) ×4 IMPLANT
SPONGE SURGIFOAM ABS GEL 100 (HEMOSTASIS) ×2 IMPLANT
SURGIFLO W/THROMBIN 8M KIT (HEMOSTASIS) ×2 IMPLANT
SUT BONE WAX W31G (SUTURE) ×2 IMPLANT
SUT MNCRL AB 3-0 PS2 27 (SUTURE) ×2 IMPLANT
SUT SILK 2 0 (SUTURE)
SUT SILK 2-0 18XBRD TIE 12 (SUTURE) IMPLANT
SUT VIC AB 2-0 CT1 18 (SUTURE) ×2 IMPLANT
SYR BULB IRRIG 60ML STRL (SYRINGE) ×2 IMPLANT
SYR CONTROL 10ML LL (SYRINGE) ×4 IMPLANT
TAPE CLOTH 4X10 WHT NS (GAUZE/BANDAGES/DRESSINGS) ×2 IMPLANT
TAPE UMBILICAL COTTON 1/8X30 (MISCELLANEOUS) ×2 IMPLANT
TOWEL GREEN STERILE (TOWEL DISPOSABLE) ×2 IMPLANT
TOWEL GREEN STERILE FF (TOWEL DISPOSABLE) ×2 IMPLANT
WATER STERILE IRR 1000ML POUR (IV SOLUTION) ×2 IMPLANT

## 2021-01-20 NOTE — Transfer of Care (Signed)
Immediate Anesthesia Transfer of Care Note  Patient: Sean [name]  Procedure(s) Performed: ANTERIOR CERVICAL DECOMPRESSION/DISCECTOMY FUSION 2 LEVELS CERVICAL FOUR-SIX (N/A Spine Cervical)  Patient Location: PACU  Anesthesia Type:General  Level of Consciousness: awake and patient cooperative  Airway & Oxygen Therapy: Patient Spontanous Breathing and Patient connected to face mask oxygen  Post-op Assessment: Report given to RN and Post -op Vital signs reviewed and stable  Post vital signs: Reviewed and stable  Last Vitals:  Vitals Value Taken Time  BP 131/72 01/20/21 1700  Temp 36.3 C 01/20/21 1700  Pulse 82 01/20/21 1701  Resp 17 01/20/21 1701  SpO2 97 % 01/20/21 1701  Vitals shown include unvalidated device data.  Last Pain:  Vitals:   01/20/21 1300  TempSrc:   PainSc: 6          Complications: No complications documented.

## 2021-01-20 NOTE — Brief Op Note (Signed)
01/20/2021  4:44 PM  PATIENT:  Sean [name]  50 y.o. male  PRE-OPERATIVE DIAGNOSIS:  Cervical spondyloitic radiculopathy  POST-OPERATIVE DIAGNOSIS:  Cervical spondyloitic radiculopathy  PROCEDURE:  Procedure(s) with comments: ANTERIOR CERVICAL DECOMPRESSION/DISCECTOMY FUSION 2 LEVELS CERVICAL FOUR-SIX (N/A) - 3 hrs  SURGEON:  Surgeon(s) and Role:    [name], MD - Primary  PHYSICIAN ASSISTANT: Cleta Alberts, PA  ASSISTANTS: Estill Bamberg Ward, PA  ANESTHESIA:   general  EBL:  30 mL   BLOOD ADMINISTERED:none  DRAINS: none   LOCAL MEDICATIONS USED:  MARCAINE     SPECIMEN:  No Specimen  DISPOSITION OF SPECIMEN:n/a  COUNTS:  YES  TOURNIQUET:  * No tourniquets in log *  DICTATION: .Dragon Dictation  PLAN OF CARE: Admit for overnight observation  PATIENT DISPOSITION:  PACU - hemodynamically stable.

## 2021-01-20 NOTE — Discharge Instructions (Signed)

## 2021-01-20 NOTE — Op Note (Signed)
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  8 mm medium lordotic cage at C4-5.  7 mm lordotic medium cage at C5-6.   NuVasive ACP cervical plate.  38 mm length.  15 mm locking screws.  Allograft: Osteocell  BRIEF HISTORY: Sean [name] is a 50 y.o. male who pleasant 50 year old gentleman with significant neck and radicular arm pain.  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  Patient had temporary improvement after selective nerve root blocks.  His overall quality of life has continued to deteriorate.  As result we elected to move forward with a two-level ACDF.  All appropriate risks benefits and alternatives as well as the potential goals of surgery were discussed with the patient.  Both he and his wife expressed understanding and willingness to move forward with surgery.  PROCEDURE DETAILS: Patient was brought into the operating room. After successful induction of general anesthesia and endotracheal intubation a Time Out was done. This confirmed all pertinent important data.  The anterior cervical spine was prepped and draped in a standard fashion.  Using fluoroscopy identified the C5 vertebral body.  I marked out my incision site and infiltrated with quarter percent Marcaine with epinephrine.  A left-sided transverse incision was made and sharp dissection was carried down to the platysma.  The platysma was isolated and sacrificed.  I continued dissecting into the deep cervical  fascia.  I took a standard Smith-Robinson approach.  Identified the medial border of sternocleidomastoid and continued my dissection.  Identified and sacrificed the omohyoid for better improved visualization.  I now bluntly dissected through the remainder of the prevertebral fascia until I could palpate the anterior aspect of the cervical spine.  Hand-held retractor was used to retract the esophagus and trachea to the right and Kitner dissectors were used to remove the remaining prevertebral fascia and expose the C4-5 and the C5-6 disc space.  A needle was placed into the C4-5 disc space level and an x-ray was taken confirming the appropriate level.  I then marked the disc space with the Bovie and remove the pin.  I then mobilized the longus coli muscles from the superior aspect of C4 to the inferior aspect of C6.  Self-retaining retractor was placed underneath the longus coli muscle and the endotracheal cuff was deflated and I expanded the retractor to the appropriate width.  C5-6 annulotomy was performed with a 15 blade scalpel and I remove the bulk of the disc material with pituitary rondure.  The inferior bone spur from the C5 vertebral body was taken down with a 2 mm Kerrison rongeur.  Distraction pins were then placed into the body of C5 and C6 and I distracted the intervertebral space.  I then maintain this with the distraction pin set.  I continued use my curettes and Kerrison rongeurs to remove all of the disc material.  I then developed a plane underneath the posterior osteophyte from the vertebral body of C5 and 6 and remove this with my 1 mm Kerrison rongeur.  I then gently  dissected through the posterior longitudinal ligament and resected this with a 1 mm Kerrison rongeur.  This allowed me to get under the uncovertebral joint and further decompress the foramen.  At this point I was pleased with the overall decompression.  Using live fluoroscopy I was able to confirm with my nerve hook by passing it  behind the vertebral body and underneath the uncovertebral joints that had an adequate decompression.  I then trialed the intervertebral spacers and made sure I had rasped the endplates to remove all the cartilaginous material and have bleeding subchondral bone.  I placed a 7 mm medium lordotic cage packed with the allograft.  This had an excellent fit.  I then repositioned the retractors to expose the C4-5 level.  Using the same exact technique I used at C5-6 I performed a discectomy at C4-5.  I again took down the posterior osteophyte from the vertebral body of C4 and C5 with a 1 mm Kerrison rongeur and remove the posterior annulus and posterior longitudinal ligament with a 1 mm Kerrison rongeur.  I again made sure had adequate decompression under the uncovertebral joints.  Once I completed the discectomy at C4-5 I rasped the endplates and after trialing elected use the size 8 mm medium lordotic cage.  This was packed with the allograft and malleted into position.  At this point time the distraction pins were removed and the resulting bleeding bone holes were sealed with bone wax.  I irrigated copiously normal saline and made sure that hemostasis using proper electrocautery.  I then contoured the anterior cervical plate and affixed it with 15 mm locking screws to the body of C4-5 and 6.  All screws had excellent purchase.  Screws were then locked to the plate according manufacture standards.  I then inspected the surgical bed to ensure the esophagus was not entrapped beneath the plate and that hemostasis.  After final irrigation I returned the trachea and esophagus to midline and then closed the platysma with interrupted 2-0 Vicryl suture.  I then closed the skin with a 3-0 Monocryl.  Steri-Strips and dry dressings were applied and the patient was ultimately extubated transfer the PACU without incident.  End of the case all needle and sponge counts were correct. [name], MD 01/20/2021 4:34 PM

## 2021-01-20 NOTE — H&P (Signed)
Addendum H&P: He presents today with ongoing back and radicular arm pain.  Despite conservative management his quality of life continues to deteriorate.  There is been no change in his clinical exam since his last office visit of 01/13/2021.  Plan on moving forward with a two-level ACDF.  I reviewed the risks, benefits, alternatives with the patient and consent was obtained, and he expressed a willingness and desire to move forward with surgery.

## 2021-01-20 NOTE — Anesthesia Postprocedure Evaluation (Signed)
Anesthesia Post Note  Patient: Sean [name]  Procedure(s) Performed: ANTERIOR CERVICAL DECOMPRESSION/DISCECTOMY FUSION 2 LEVELS CERVICAL FOUR-SIX (N/A Spine Cervical)     Patient location during evaluation: PACU Anesthesia Type: General Level of consciousness: awake Pain management: pain level controlled Vital Signs Assessment: post-procedure vital signs reviewed and stable Respiratory status: spontaneous breathing, nonlabored ventilation, respiratory function stable and patient connected to nasal cannula oxygen Cardiovascular status: blood pressure returned to baseline and stable Postop Assessment: no apparent nausea or vomiting Anesthetic complications: no   No complications documented.  Last Vitals:  Vitals:   01/20/21 1800 01/20/21 1826  BP:  138/85  Pulse:  85  Resp:  18  Temp: 36.4 C 36.6 C  SpO2:  94%    Last Pain:  Vitals:   01/20/21 2118  TempSrc:   PainSc: 7                  Moody Robben P [place] Duan

## 2021-01-20 NOTE — Anesthesia Procedure Notes (Signed)
Procedure Name: Intubation Date/Time: 01/20/2021 1:56 PM Performed by: Jenne Campus, CRNA Pre-anesthesia Checklist: Patient identified, Emergency Drugs available, Suction available and Patient being monitored Patient Re-evaluated:Patient Re-evaluated prior to induction Oxygen Delivery Method: Circle System Utilized Preoxygenation: Pre-oxygenation with 100% oxygen Induction Type: IV induction Ventilation: Mask ventilation without difficulty, Oral airway inserted - appropriate to patient size and Two handed mask ventilation required Laryngoscope Size: Glidescope and 4 Grade View: Grade I Tube type: Oral Tube size: 8.0 mm Number of attempts: 1 Airway Equipment and Method: Stylet and Oral airway Placement Confirmation: ETT inserted through vocal cords under direct vision,  positive ETCO2 and breath sounds checked- equal and bilateral Secured at: 22 cm Tube secured with: Tape Dental Injury: Teeth and Oropharynx as per pre-operative assessment

## 2021-01-21 ENCOUNTER — Encounter (HOSPITAL_COMMUNITY): Payer: Self-pay | Admitting: Orthopedic Surgery

## 2021-01-21 DIAGNOSIS — M5021 Other cervical disc displacement,  high cervical region: Secondary | ICD-10-CM | POA: Diagnosis not present

## 2021-01-21 MED ORDER — ONDANSETRON HCL 4 MG PO TABS: 4.0000 mg | ORAL_TABLET | ORAL | Status: DC | PRN

## 2021-01-21 MED ORDER — ONDANSETRON HCL 4 MG/2ML IJ SOLN: 4.0000 mg | INTRAMUSCULAR | Status: DC | PRN

## 2021-01-21 NOTE — Progress Notes (Addendum)
Physical Therapy Treatment Patient Details Name: Sean [name] MRN: [id number] DOB: Feb 02, 1971 Today's Date: 01/21/2021    History of Present Illness Pt is a 50 y/o male s/p ACDF C4-6 on 01/20/2021. PMH including carpal tunnel syndrome, testicular cancer (2003), and Nephrolithiasis.    PT Comments    Pt admitted with above diagnosis. At the time of PT eval, pt was able to demonstrate transfers and ambulation with gross min guard assist to close supervision for safety and no AD. Pt limited throughout due to pain but was able to complete gait and stair training. Pt was educated on precautions, brace application/wearing schedule, appropriate activity progression, and car transfer. Pt currently with functional limitations due to the deficits listed below (see PT Problem List). Pt will benefit from skilled PT to increase their independence and safety with mobility to allow discharge to the venue listed below.      Follow Up Recommendations  No PT follow up;Supervision for mobility/OOB     Equipment Recommendations  None recommended by PT    Recommendations for Other Services       Precautions / Restrictions Precautions Precautions: Cervical Precaution Booklet Issued: Yes (comment) Precaution Comments: reviewed cervical precautions Required Braces or Orthoses: Cervical Brace Cervical Brace: Hard collar;Other (comment) (Off in bed or with shower) Restrictions Weight Bearing Restrictions: No    Mobility  Bed Mobility Overal bed mobility: Needs Assistance Bed Mobility: Sit to Sidelying         Sit to sidelying: Supervision General bed mobility comments: Assist to get pillows positioned properly under head in sidelying. Pt performed bed mobility with HOB flat and rails lowered to simulate home environment.    Transfers Overall transfer level: Needs assistance Equipment used: None Transfers: Sit to/from Stand Sit to Stand: Supervision         General transfer comment:  Increased time and generally very effortful.  Ambulation/Gait Ambulation/Gait assistance: Supervision;Min guard Gait Distance (Feet): 200 Feet Assistive device: None Gait Pattern/deviations: Step-through pattern;Decreased stride length;Drifts right/left Gait velocity: Decreased Gait velocity interpretation: <1.31 ft/sec, indicative of household ambulator General Gait Details: Occasionally unsteady requiring min guard assist progressing to close supervision for safety.   Stairs Stairs: Yes Stairs assistance: Min guard Stair Management: One rail Left;Step to pattern;Forwards Number of Stairs: 5 General stair comments: VC's for sequencing and general safety.   Wheelchair Mobility    Modified Rankin (Stroke Patients Only)       Balance Overall balance assessment: No apparent balance deficits (not formally assessed)                                          Cognition Arousal/Alertness: Awake/alert Behavior During Therapy: WFL for tasks assessed/performed Overall Cognitive Status: Within Functional Limits for tasks assessed Area of Impairment: Problem solving                             Problem Solving: Slow processing General Comments: Requiring increased time and cues at times.      Exercises Other Exercises Other Exercises: Finger opposition x10. Pt slow and intentional. Other Exercises: Theraputty exercises; yellow soft. x10. Roll into ball, flatten, roll in log, pinch with each finger    General Comments General comments (skin integrity, edema, etc.): Wife present      Pertinent Vitals/Pain Pain Assessment: Faces Faces Pain Scale: Hurts even more Pain Location:  neck Pain Descriptors / Indicators: Grimacing Pain Intervention(s): Limited activity within patient's tolerance;Monitored during session;Repositioned    Home Living Family/patient expects to be discharged to:: Private residence Living Arrangements: Spouse/significant  other Available Help at Discharge: Family;Available 24 hours/day Type of Home: House Home Access: Stairs to enter   Home Layout: One level Home Equipment: Shower seat      Prior Function Level of Independence: Independent      Comments: Prior to injury, 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   PT Goals (current goals can now be found in the care plan section) Acute Rehab PT Goals Patient Stated Goal: Go home PT Goal Formulation: With patient Time For Goal Achievement: 01/28/21 Potential to Achieve Goals: Good    Frequency    Min 5X/week      PT Plan      Co-evaluation              AM-PAC PT "6 Clicks" Mobility   Outcome Measure  Help needed turning from your back to your side while in a flat bed without using bedrails?: None Help needed moving from lying on your back to sitting on the side of a flat bed without using bedrails?: None Help needed moving to and from a bed to a chair (including a wheelchair)?: None Help needed standing up from a chair using your arms (e.g., wheelchair or bedside chair)?: None Help needed to walk in hospital room?: A Little Help needed climbing 3-5 steps with a railing? : A Little 6 Click Score: 22    End of Session Equipment Utilized During Treatment: Gait belt Activity Tolerance: Patient tolerated treatment well Patient left: in bed;with call bell/phone within reach;with family/visitor present Nurse Communication: Mobility status PT Visit Diagnosis: Unsteadiness on feet (R26.81);Pain Pain - part of body:  (neck)     Time: 6389-3734 PT Time Calculation (min) (ACUTE ONLY): 15 min  Charges:   Low Complexity Eval                     Rolinda Roan, PT, DPT Acute Rehabilitation Services Pager: [phone] Office: [phone]    Thelma Comp 01/21/2021, 11:29 AM

## 2021-01-21 NOTE — Progress Notes (Signed)
    Subjective: Procedure(s) (LRB): ANTERIOR CERVICAL DECOMPRESSION/DISCECTOMY FUSION 2 LEVELS CERVICAL FOUR-SIX (N/A) 1 Day Post-Op  Patient reports pain as 1 on 0-10 scale.  Reports decreased arm pain reports incisional neck pain   Positive void Negative bowel movement Positive flatus Negative chest pain or shortness of breath  Objective: Vital signs in last 24 hours: Temp:  [97.4 F (36.3 C)-98.4 F (36.9 C)] 98.3 F (36.8 C) (03/11 0523) Pulse Rate:  [77-96] 96 (03/11 0523) Resp:  [11-20] 18 (03/11 0523) BP: (120-144)/(54-86) 131/69 (03/11 0523) SpO2:  [91 %-98 %] 93 % (03/11 0523) Weight:  [125.6 kg] 125.6 kg (03/10 1245)  Intake/Output from previous day: 03/10 0701 - 03/11 0700 In: 1200 [I.V.:1000; IV Piggyback:200] Out: 200 [Urine:150; Blood:50]  Labs: Recent Labs    01/18/21 1055  WBC 5.9  RBC 5.49  HCT 48.1  PLT 224   Recent Labs    01/18/21 1055  NA 138  K 3.4*  CL 104  CO2 25  BUN 7  CREATININE 0.84  GLUCOSE 101*  CALCIUM 9.0   Recent Labs    01/20/21 1223  INR 1.1    Physical Exam: ABD soft Neurovascular intact Intact pulses distally Incision: dressing C/D/I Compartment soft Body mass index is 40.91 kg/m.  Assessment/Plan: Patient stable  xrays n/a Mobilization with physical therapy Encourage incentive spirometry Continue care  Up with therapy  Patient is doing well overall.  Primary complains of neck pain which is to be expected.  He also notes shoulder pain which is to be expected given the need for retraction during surgery.  Still has some numbness and dysesthesias into the right hand but no radicular arm pain. At this point he is ambulating without difficulty, he is voiding spontaneously and his pain is more typical of postoperative surgical pain rather than his neuropathic pain brought him to surgery in the first place.  At this point I will plan on discharge to home later this morning.  I have counseled him on some  self-directed stretching exercises to help reduce the axillary neck pain.  He will follow-up with me in 2 weeks for reevaluation.   [name], MD [facility] [phone]

## 2021-01-21 NOTE — Progress Notes (Signed)
Occupational Therapy Treatment Patient Details Name: Sean [name] MRN: [id number] DOB: 09-28-1971 Today's Date: 01/21/2021    History of present illness 50 yo male s/p ACDF C4-6. PMH including carpal tunnel syndrome, testicular cancer (2003), and Nephrolithiasis.   OT comments  Returning for second visit to provide education on RUE ROM and theraputty exercises. Providing yellow (soft) theraputty and reviewing handout. Pt performing several sets to complete for each finger. Answering all questions in preparation for dc. All acute OT needs met and will sign off.    Follow Up Recommendations  No OT follow up    Equipment Recommendations  None recommended by OT    Recommendations for Other Services PT consult    Precautions / Restrictions Precautions Precautions: Cervical Precaution Booklet Issued: Yes (comment) Precaution Comments: reviewed cervical precautions Required Braces or Orthoses: Cervical Brace Cervical Brace: Hard collar;Other (comment) (Off in bed or with shower) Restrictions Weight Bearing Restrictions: No       Mobility Bed Mobility               General bed mobility comments: Sitting EOB upon arrival    Transfers Overall transfer level: Needs assistance   Transfers: Sit to/from Stand Sit to Stand: Supervision              Balance Overall balance assessment: No apparent balance deficits (not formally assessed)                                         ADL either performed or assessed with clinical judgement   ADL Overall ADL's : Modified independent                                       General ADL Comments: Focused session on RUE theraputty exercises.     Vision Baseline Vision/History: Wears glasses Wears Glasses: At all times Patient Visual Report: No change from baseline     Perception     Praxis      Cognition Arousal/Alertness: Awake/alert Behavior During Therapy: WFL for tasks  assessed/performed Overall Cognitive Status: Within Functional Limits for tasks assessed Area of Impairment: Problem solving                             Problem Solving: Slow processing General Comments: Requiring increased time and cues at times.        Exercises Exercises: Other exercises Other Exercises Other Exercises: Finger opposition x10. Pt slow and intentional. Other Exercises: Theraputty exercises; yellow soft. x10. Roll into ball, flatten, roll in log, pinch with each finger   Shoulder Instructions       General Comments Wife present    Pertinent Vitals/ Pain       Pain Assessment: Faces Faces Pain Scale: Hurts little more Pain Location: neck Pain Descriptors / Indicators: Grimacing Pain Intervention(s): Monitored during session  Home Living Family/patient expects to be discharged to:: Private residence Living Arrangements: Spouse/significant other Available Help at Discharge: Family;Available 24 hours/day Type of Home: House Home Access: Stairs to enter [employer] of Steps: 4-5   Home Layout: One level     Bathroom Shower/Tub: Tub/shower unit;Walk-in Psychologist, prison and probation services: Standard     Home Equipment: Civil engineer, contracting  Prior Functioning/Environment Level of Independence: Independent        Comments: Prior to injury, 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   Geologist, engineering 2X/week        Progress Toward Goals  OT Goals(current goals can now be found in the care plan section)  Progress towards OT goals: Progressing toward goals  Acute Rehab OT Goals Patient Stated Goal: Go home OT Goal Formulation: With patient/family Time For Goal Achievement: 02/04/21 Potential to Achieve Goals: Good  Plan      Co-evaluation                 AM-PAC OT "6 Clicks" Daily Activity     Outcome Measure   Help from another person eating meals?: None Help from another person  taking care of personal grooming?: None Help from another person toileting, which includes using toliet, bedpan, or urinal?: A Little Help from another person bathing (including washing, rinsing, drying)?: A Little Help from another person to put on and taking off regular upper body clothing?: None Help from another person to put on and taking off regular lower body clothing?: A Little 6 Click Score: 21    End of Session Equipment Utilized During Treatment: Cervical collar  OT Visit Diagnosis: Unsteadiness on feet (R26.81);Other abnormalities of gait and mobility (R26.89);Muscle weakness (generalized) (M62.81);Pain Pain - part of body:  (neck)   Activity Tolerance Patient tolerated treatment well   Patient Left in bed;with call bell/phone within reach;with family/visitor present (EOB)   Nurse Communication Mobility status        Time: 4782-9562 OT Time Calculation (min): 14 min  Charges: OT General Charges $OT Visit: 1 Visit OT Treatments $Therapeutic Exercise: 8-22 mins  [place], OTR/L Acute Rehab Pager: [phone] Office: [place] 01/21/2021, 9:20 AM

## 2021-01-21 NOTE — Progress Notes (Signed)
Patient is discharged from room 3C07 at this time. Alert and in stable condition. IV site d/c'd and instructions read to patient with understanding verbalized and all questions answered. Left unit via wheelchair with all belongings at side. Patient has an appointment with Dr. Rolena Infante in 2 weeks

## 2021-01-21 NOTE — Evaluation (Signed)
Occupational Therapy Evaluation Patient Details Name: Sean [name] MRN: [id number] DOB: 1971/02/08 Today's Date: 01/21/2021    History of Present Illness 50 yo male s/p ACDF C4-6. PMH including carpal tunnel syndrome, testicular cancer (2003), and Nephrolithiasis.   Clinical Impression   PTA, pt was living with his wife and was independent. Currently, pt requires Supervision-Mod I for ADLs and functional mobility. Provided education and handout on cervical precaution, bed mobility, grooming, collar management, UB ADLs,LB ADLs, toilet transfer, and shower transfer with shower seat; pt demonstrated understanding. Answered all pt questions for ADLs and precautions. Recommend dc home once medically stable per physician. Will need additional OT session for hand exercises and theraputty education; will return later.     Follow Up Recommendations  No OT follow up    Equipment Recommendations  None recommended by OT    Recommendations for Other Services PT consult     Precautions / Restrictions Precautions Precautions: Cervical Precaution Booklet Issued: Yes (comment) Precaution Comments: reviewd cervical precautions Required Braces or Orthoses: Cervical Brace Cervical Brace: Hard collar;Other (comment) (Off in bed or with shower) Restrictions Weight Bearing Restrictions: No      Mobility Bed Mobility               General bed mobility comments: Sitting EOB upon arrival    Transfers Overall transfer level: Needs assistance   Transfers: Sit to/from Stand Sit to Stand: Supervision              Balance Overall balance assessment: No apparent balance deficits (not formally assessed)                                         ADL either performed or assessed with clinical judgement   ADL Overall ADL's : Modified independent                                       General ADL Comments: Pt performing ADLs and functional mobility with  SUpervision-Mod I (increased time). Educating pt on cervical precautions, bed mobility, UB ADLs, LB ADLs, toileting, grooming, and brace management. Requiring additional assistance for donning collar which wife will assist with. DOnning clothes and using figure four method     Vision Baseline Vision/History: Wears glasses Wears Glasses: At all times Patient Visual Report: No change from baseline       Perception     Praxis      Pertinent Vitals/Pain Pain Assessment: Faces Faces Pain Scale: Hurts little more Pain Location: neck Pain Descriptors / Indicators: Grimacing Pain Intervention(s): Monitored during session     Hand Dominance Right   Extremity/Trunk Assessment Upper Extremity Assessment Upper Extremity Assessment: RUE deficits/detail RUE Deficits / Details: Poor strength and FM coordanation RUE Coordination: decreased fine motor   Lower Extremity Assessment Lower Extremity Assessment: Defer to PT evaluation   Cervical / Trunk Assessment Cervical / Trunk Assessment: Other exceptions Cervical / Trunk Exceptions: s/p cervical sx   Communication Communication Communication: No difficulties   Cognition Arousal/Alertness: Awake/alert Behavior During Therapy: WFL for tasks assessed/performed Overall Cognitive Status: Within Functional Limits for tasks assessed Area of Impairment: Problem solving                             Problem Solving: Slow  processing General Comments: Requiring increased time and cues at times.   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        OT Problem List: Decreased strength;Decreased activity tolerance;Decreased knowledge of use of DME or AE;Decreased knowledge of precautions;Impaired UE functional use;Pain      OT Treatment/Interventions: Self-care/ADL training;Therapeutic exercise;DME and/or AE instruction;Energy conservation;Therapeutic activities;Patient/family education    OT Goals(Current goals can be found in the care plan section) Acute Rehab OT Goals Patient Stated Goal: Go home OT Goal Formulation: With patient/family Time For Goal Achievement: 02/04/21 Potential to Achieve Goals: Good  OT Frequency: Min 2X/week   Barriers to D/C:            Co-evaluation              AM-PAC OT "6 Clicks" Daily Activity     Outcome Measure Help from another person eating meals?: None Help from another person taking care of personal grooming?: None Help from another person toileting, which includes using toliet, bedpan, or urinal?: A Little Help from another person bathing (including washing, rinsing, drying)?: A Little Help from another person to put on and taking off regular upper body clothing?: None Help from another person to put on and taking off regular lower body clothing?: A Little 6 Click Score: 21   End of Session Equipment Utilized During Treatment: Cervical collar Nurse Communication: Mobility status  Activity Tolerance: Patient tolerated treatment well Patient left: in bed;with call bell/phone within reach;with family/visitor present (EOB)  OT Visit Diagnosis: Unsteadiness on feet (R26.81);Other abnormalities of gait and mobility (R26.89);Muscle weakness (generalized) (M62.81);Pain Pain - part of body:  (neck)                Time:  9470-9628 OT Time Calculation (min): 17 min Charges:  OT General Charges $OT Visit: 1 Visit OT Evaluation $OT Eval Low Complexity: [place], OTR/L Acute Rehab Pager: [phone] Office: [place] 01/21/2021, 8:38 AM

## 2021-01-24 NOTE — Discharge Summary (Signed)
Patient ID: Sean [name] MRN: [id number] DOB/AGE: 1971/04/19 50 y.o.  Admit date: 01/20/2021 Discharge date: 01/24/2021  Admission Diagnoses:  Active Problems:   Cervical disc herniation   Discharge Diagnoses:  Active Problems:   Cervical disc herniation  status post Procedure(s): ANTERIOR CERVICAL DECOMPRESSION/DISCECTOMY FUSION 2 LEVELS CERVICAL FOUR-SIX  Past Medical History:  Diagnosis Date  . 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 History of kidney stones   . Sleep apnea    pt states it was very mild and does not have to wear CPAP    Surgeries: Procedure(s): ANTERIOR CERVICAL DECOMPRESSION/DISCECTOMY FUSION 2 LEVELS CERVICAL FOUR-SIX on 01/20/2021   Consultants:   Discharged Condition: Improved  Hospital Course: Sean [name] is an 50 y.o. male who was admitted 01/20/2021 for operative treatment of Cervical spondyloitic radiculopathy. Patient failed conservative treatments (please see the history and physical for the specifics) and had severe unremitting pain that affects sleep, daily activities and work/hobbies. After pre-op clearance, the patient was taken to the operating room on 01/20/2021 and underwent  Procedure(s): ANTERIOR CERVICAL DECOMPRESSION/DISCECTOMY FUSION 2 LEVELS CERVICAL FOUR-SIX.    Patient was given perioperative antibiotics:  Anti-infectives (From admission, onward)   Start     Dose/Rate Route Frequency Ordered Stop   01/20/21 1915  ceFAZolin (ANCEF) IVPB 1 g/50 mL premix        1 g 100 mL/hr over 30 Minutes Intravenous Every 8 hours 01/20/21 1827 01/21/21 0329   01/20/21 0700  ceFAZolin (ANCEF) 3 g in dextrose 5 % 50 mL IVPB  Status:  Discontinued        3 g 100 mL/hr over 30 Minutes Intravenous 30 min pre-op 01/19/21 1337 01/20/21 1826       Patient was given sequential compression devices and early ambulation to prevent DVT.   Patient benefited maximally from hospital stay and there were no complications. At the time of discharge, the  patient was urinating/moving their bowels without difficulty, tolerating a regular diet, pain is controlled with oral pain medications and they have been cleared by PT/OT.   Recent vital signs: No data found.   Recent laboratory studies: No results for input(s): WBC, HGB, HCT, PLT, NA, K, CL, CO2, BUN, CREATININE, GLUCOSE, INR, CALCIUM in the last 72 hours.  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   famotidine 20 MG tablet Commonly known as: PEPCID Take 20 mg by mouth daily as needed for heartburn or indigestion.   methocarbamol 500 MG tablet Commonly known as: Robaxin Take 1 tablet (500 mg total) by mouth every 8 (eight) hours as needed  for up to 5 days for muscle spasms.   ondansetron 4 MG tablet Commonly known as: Zofran Take 1 tablet (4 mg total) by mouth every 8 (eight) hours as needed for nausea or vomiting.   oxyCODONE-acetaminophen 10-325 MG tablet Commonly known as: Percocet Take 1 tablet by mouth every 6 (six) hours as needed for up to 5 days for pain.       Diagnostic Studies: DG Chest 2 View  Result Date: 01/18/2021 CLINICAL DATA:  Pre-admit for cervical fusion.  Former smoker. EXAM: CHEST - 2 VIEW  COMPARISON:  Radiographs 09/15/2020 FINDINGS: The heart size and mediastinal contours are normal. The lungs are clear. There is no pleural effusion or pneumothorax. No acute osseous findings are identified. There are surgical clips in the right upper quadrant of the abdomen. IMPRESSION: No active cardiopulmonary process. Electronically Signed   By: Richardean Sale M.D.   On: 01/18/2021 17:03   DG Cervical Spine 2-3 Views  Result Date: 01/20/2021 CLINICAL DATA:  Anterior cervical decompression/discectomy fusion 2 levels 4-6. EXAM: DG C-ARM 1-60 MIN; CERVICAL SPINE - 2-3 VIEW FLUOROSCOPY TIME:  Fluoroscopy Time:  48 seconds Radiation Exposure Index (if provided by the fluoroscopic device): 13.43 mGy Number of Acquired Spot Images: 6 COMPARISON:  None. FINDINGS: Six fluoroscopic spot views obtained in the operating room in frontal and lateral projections. Anterior fusion C4 through C6 with interbody spacers. IMPRESSION: Intraoperative fluoroscopy during C4-C6 anterior fusion. Electronically Signed   By: Keith Rake M.D.   On: 01/20/2021 16:32   DG C-Arm 1-60 Min  Result Date: 01/20/2021 CLINICAL DATA:  Anterior cervical decompression/discectomy fusion 2 levels 4-6. EXAM: DG C-ARM 1-60 MIN; CERVICAL SPINE - 2-3 VIEW FLUOROSCOPY TIME:  Fluoroscopy Time:  48 seconds Radiation Exposure Index (if provided by the fluoroscopic device): 13.43 mGy Number of Acquired Spot Images: 6 COMPARISON:  None. FINDINGS: Six fluoroscopic spot views obtained in the operating room in frontal and lateral projections. Anterior fusion C4 through C6 with interbody spacers. IMPRESSION: Intraoperative fluoroscopy during C4-C6 anterior fusion. Electronically Signed   By: Keith Rake M.D.   On: 01/20/2021 16:32    Discharge Instructions    Incentive spirometry RT   Complete by: As directed        Follow-up Information    [name], MD. Schedule an appointment as soon as possible for a visit in 2 weeks.   Specialty:  Orthopedic Surgery Why: If symptoms worsen, For suture removal, For wound re-check Contact information: [address] [place] [address] [phone]               Discharge Plan:  discharge to home  Disposition: stable    Signed: Yvonne Kendall Ward for [facility] PA-C [facility] [phone] 01/24/2021, 10:22 AM

## 2021-05-16 IMAGING — RF DG CERVICAL SPINE 2 OR 3 VIEWS
1 series · 6 of 6 positions shown · non-contrast
Comparison: None.

CLINICAL DATA: Anterior cervical decompression/discectomy fusion 2
levels 4-6.

EXAM:
DG C-ARM 1-60 MIN; CERVICAL SPINE - 2-3 VIEW
FLUOROSCOPY TIME:  Fluoroscopy Time:  48 seconds
Radiation Exposure Index (if provided by the fluoroscopic device):
13.43 mGy
Number of Acquired Spot Images: 6

[Series 1: run · 6 of 6 slices shown]
[im 1/6]
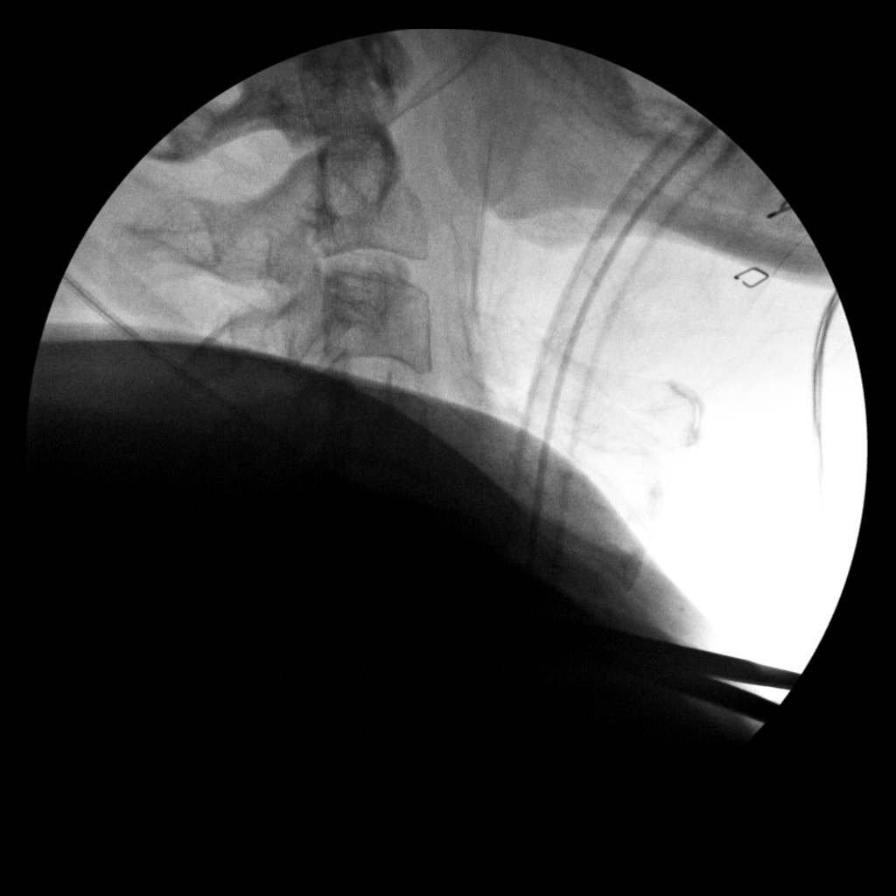
[im 2/6]
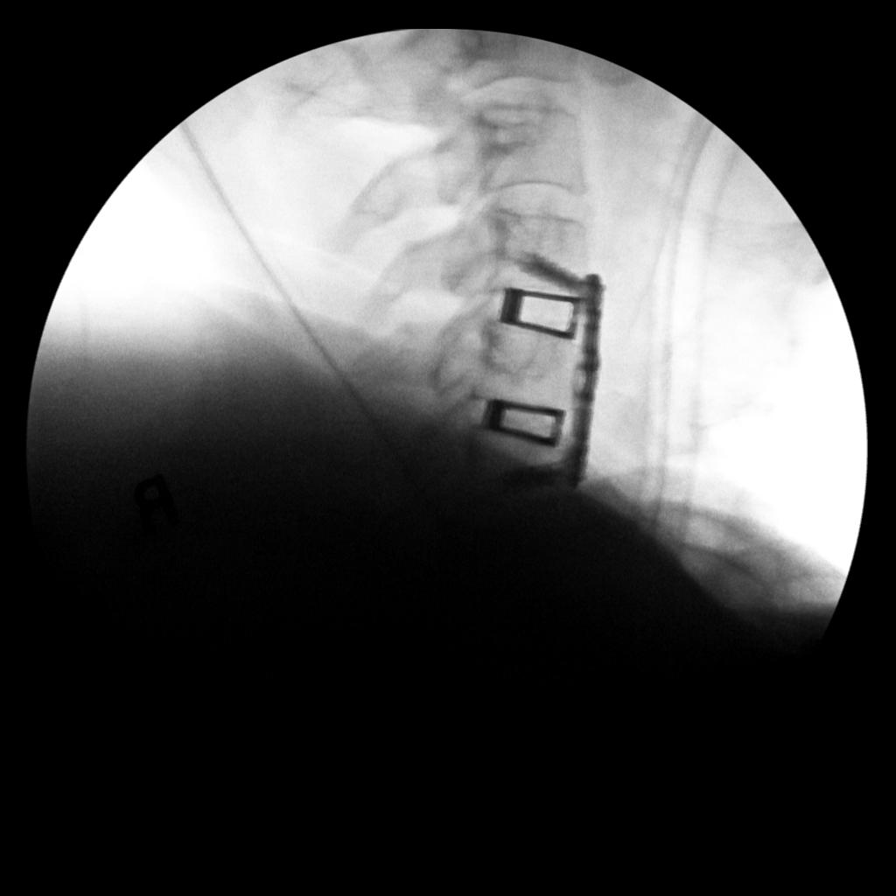
[im 3/6]
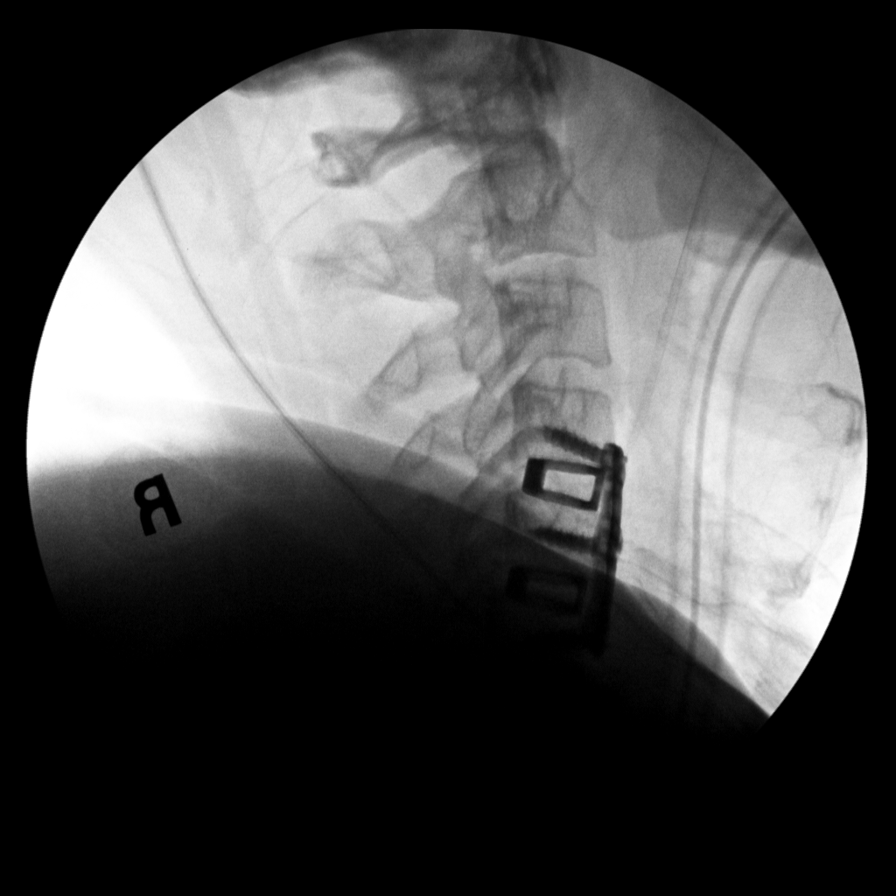
[im 4/6]
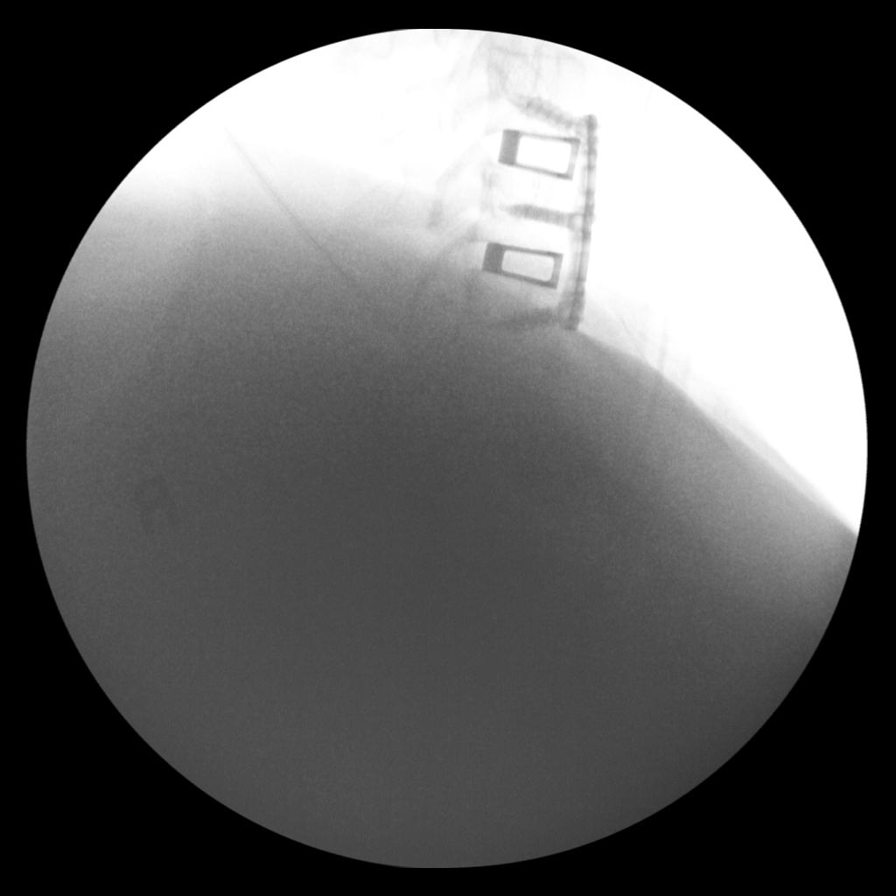
[im 5/6]
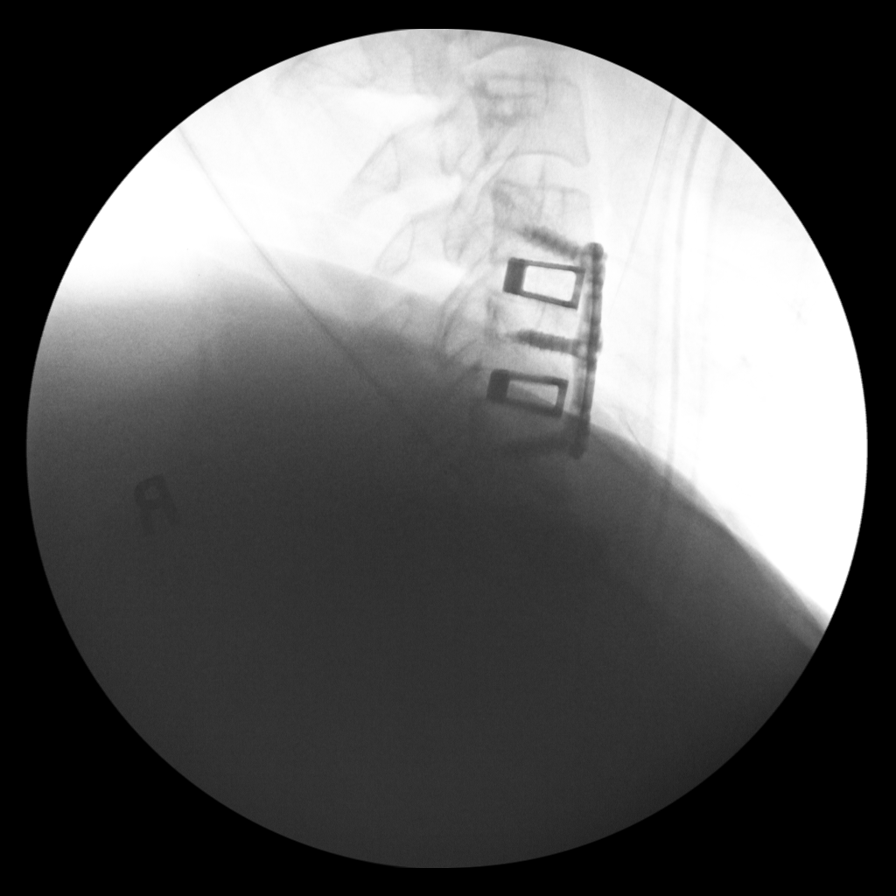
[im 6/6]
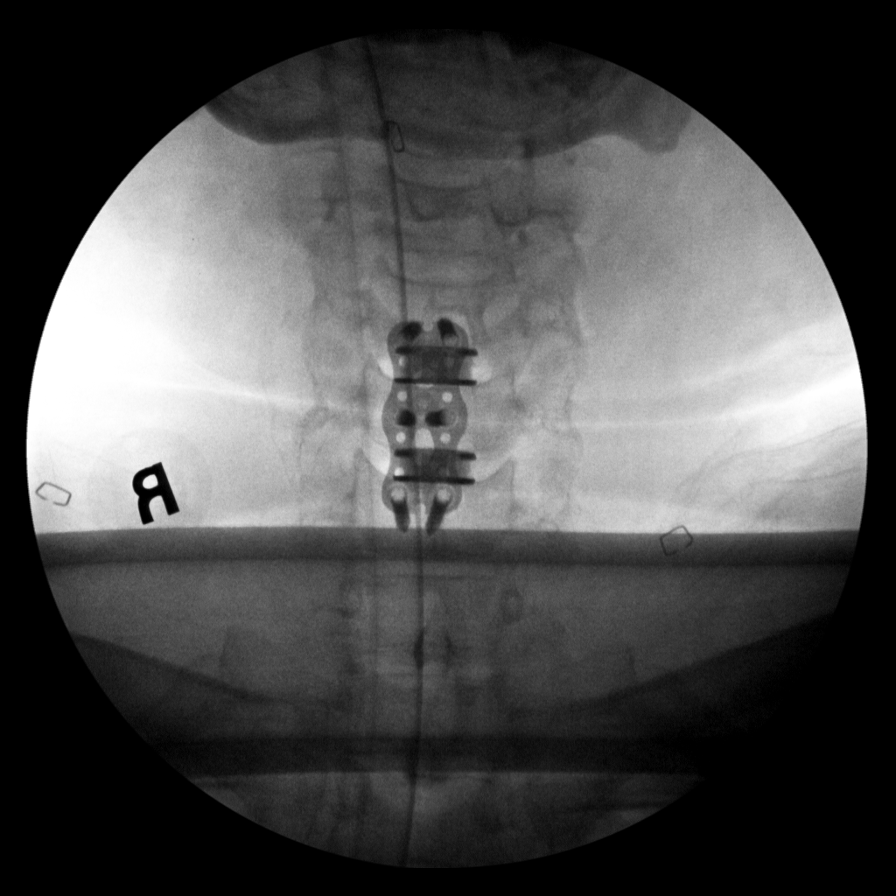

[6 of 6 positions shown; findings below may reference images not displayed]

FINDINGS: Six fluoroscopic spot views obtained in the operating room in
frontal and lateral projections. Anterior fusion C4 through C6 with
interbody spacers.
IMPRESSION: Intraoperative fluoroscopy during C4-C6 anterior fusion.

## 2021-07-05 ENCOUNTER — Other Ambulatory Visit: Payer: Self-pay

## 2021-07-05 ENCOUNTER — Ambulatory Visit (INDEPENDENT_AMBULATORY_CARE_PROVIDER_SITE_OTHER): Payer: BLUE CROSS/BLUE SHIELD | Admitting: Urology

## 2021-07-05 ENCOUNTER — Encounter: Payer: Self-pay | Admitting: Urology

## 2021-07-05 VITALS — BP 127/80 | HR 86

## 2021-07-05 DIAGNOSIS — Z87442 Personal history of urinary calculi: Secondary | ICD-10-CM | POA: Diagnosis not present

## 2021-07-05 DIAGNOSIS — R351 Nocturia: Secondary | ICD-10-CM

## 2021-07-05 LAB — URINALYSIS, ROUTINE W REFLEX MICROSCOPIC
Bilirubin, UA: NEGATIVE
Glucose, UA: NEGATIVE
Ketones, UA: NEGATIVE
Leukocytes,UA: NEGATIVE
Nitrite, UA: NEGATIVE
Protein,UA: NEGATIVE
RBC, UA: NEGATIVE
Specific Gravity, UA: 1.02 (ref 1.005–1.030)
Urobilinogen, Ur: 0.2 mg/dL (ref 0.2–1.0)
pH, UA: 5.5 (ref 5.0–7.5)

## 2021-07-05 NOTE — Progress Notes (Signed)
Urological Symptom Review  Patient is experiencing the following symptoms: Get up at night to urinate   Review of Systems  Gastrointestinal (upper)  : Negative for upper GI symptoms  Gastrointestinal (lower) : Negative for lower GI symptoms  Constitutional : Negative for symptoms  Skin: Negative for skin symptoms  Eyes: Negative for eye symptoms  Ear/Nose/Throat : Negative for Ear/Nose/Throat symptoms  Hematologic/Lymphatic: Negative for Hematologic/Lymphatic symptoms  Cardiovascular : Negative for cardiovascular symptoms  Respiratory : Negative for respiratory symptoms  Endocrine: Negative for endocrine symptoms  Musculoskeletal: Negative for musculoskeletal symptoms  Neurological: Negative for neurological symptoms  Psychologic: negative

## 2021-07-05 NOTE — Progress Notes (Signed)
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  He did have retroperitoneal radiation.  He had a left ureteral stone treated with ureteroscopy in 2018.  He did not have any renal calculi at that time.  He has had no recurrence of his stone disease.  He is here today for routine check.  He does have nocturia x1.  No other daytime urinary symptoms.  No complaints of ED.  Past Medical History:  Diagnosis Date   Cancer [facility]) 2003   testicular   History of kidney stones    Sleep apnea    pt states it was very mild and does not have to wear CPAP    Past Surgical History:  Procedure Laterality Date   ANTERIOR CERVICAL DECOMP/DISCECTOMY FUSION N/A 01/20/2021   Procedure: ANTERIOR CERVICAL DECOMPRESSION/DISCECTOMY FUSION 2 LEVELS CERVICAL FOUR-SIX;  Surgeon: [name], MD;  Location: [facility];  Service: Orthopedics;  Laterality: N/A;  3 hrs   CYSTOSCOPY W/ URETERAL STENT PLACEMENT Right 03/25/2015   Procedure: CYSTOSCOPY, RETROGRADE PYELOGRAM  WITH RIGHT URETERAL STENT PLACEMENT;  Surgeon: [name], MD;  Location: [facility];  Service: Urology;  Laterality: Right;   CYSTOSCOPY WITH RETROGRADE PYELOGRAM, URETEROSCOPY AND STENT PLACEMENT Left 07/04/2015   Procedure: CYSTOSCOPY WITH RETROGRADE PYELOGRAM, URETEROSCOPY AND STENT PLACEMENT;  Surgeon: [name], MD;  Location: [facility];  Service: Urology;  Laterality: Left;  With Holmium Laser   SURGERY SCROTAL / TESTICULAR  2003   for testicular cancer    Home Medications:  Allergies as of 07/05/2021       Reactions   Bee Venom Anaphylaxis   Shellfish Allergy Anaphylaxis   Alpha-gal    Pt has alpha gal but doesn't react to red meat or dairy    Gabapentin    Weird dreams   Hydromorphone Hcl Nausea And Vomiting, Rash   DILAUDID        Medication List        Accurate as of July 05, 2021   8:11 AM. If you have any questions, ask your nurse or doctor.          EPINEPHrine 0.3 mg/0.3 mL Soaj injection Commonly known as: EPI-PEN Inject 0.3 mg into the muscle as needed for anaphylaxis.   famotidine 20 MG tablet Commonly known as: PEPCID Take 20 mg by mouth daily as needed for heartburn or indigestion.   ondansetron 4 MG tablet Commonly known as: Zofran Take 1 tablet (4 mg total) by mouth every 8 (eight) hours as needed for nausea or vomiting.        Allergies:  Allergies  Allergen Reactions   Bee Venom Anaphylaxis   Shellfish Allergy Anaphylaxis   Alpha-Gal     Pt has alpha gal but doesn't react to red meat or dairy    Gabapentin     Weird dreams   Hydromorphone Hcl Nausea And Vomiting and Rash    DILAUDID    No family history on file.  Social History:  reports that he has quit smoking. His smokeless tobacco use includes chew. He reports that he does not drink alcohol and does not use drugs.  ROS: A complete review of systems was performed.  All systems are negative except for pertinent findings as noted.  Physical Exam:  Vital signs in last 24 hours: There were no vitals taken for this visit. Constitutional:  Alert and oriented, No  acute distress Cardiovascular: Regular rate  Respiratory: Normal respiratory effort GI: Abdomen is soft, nontender, nondistended, no abdominal masses. No CVAT.  No inguinal hernias.  Abdomen is obese. Genitourinary: Normal male phallus, testes are descended bilaterally and non-tender and without masses, scrotum is normal in appearance without lesions or masses, perineum is normal on inspection.  Normal anal sphincter tone, prostate 30 g, symmetrical, nonnodular, nontender. Lymphatic: No lymphadenopathy Neurologic: Grossly intact, no focal deficits Psychiatric: Normal mood and affect  I have reviewed prior pt notes  I have reviewed urinalysis results.  Urine clear today.  I have independently reviewed prior imaging--CT  scan from 2018 reviewed.    Impression/Assessment:  1.  History of urolithiasis, status post left ureteroscopic stone extraction in 2018.  No evidence of recurrence since that time.  He does not follow dietary modification.  He had no remaining stones in his kidneys at that time  2.  Nocturia, mild, normal for age  48.  History of seminoma of left testicle, status post left orchiectomy and adjuvant radiation in 2004.  No evidence of recurrence  Plan:  1.  Dietary guidelines discussed with the patient-limit sodium, increase fluid, possibly add citrus juice to his water, limited animal protein  2.  I will have him come back on an as-needed basis.  I did recommend that he have his PSA checked by his PCP, Dr. Gerarda Fraction

## 2022-03-07 ENCOUNTER — Ambulatory Visit: Payer: BLUE CROSS/BLUE SHIELD | Admitting: Urology

## 2022-03-27 ENCOUNTER — Other Ambulatory Visit: Payer: Self-pay

## 2022-03-27 ENCOUNTER — Emergency Department (HOSPITAL_BASED_OUTPATIENT_CLINIC_OR_DEPARTMENT_OTHER): Payer: BLUE CROSS/BLUE SHIELD | Admitting: Radiology

## 2022-03-27 ENCOUNTER — Emergency Department (HOSPITAL_BASED_OUTPATIENT_CLINIC_OR_DEPARTMENT_OTHER)
Admission: EM | Admit: 2022-03-27 | Discharge: 2022-03-27 | Disposition: A | Discharge status: Home / Self Care | Payer: BLUE CROSS/BLUE SHIELD | Attending: Emergency Medicine | Admitting: Emergency Medicine

## 2022-03-27 DIAGNOSIS — M7989 Other specified soft tissue disorders: Secondary | ICD-10-CM | POA: Diagnosis present

## 2022-03-27 DIAGNOSIS — R600 Localized edema: Secondary | ICD-10-CM | POA: Diagnosis not present

## 2022-03-27 LAB — URINALYSIS, ROUTINE W REFLEX MICROSCOPIC
Bilirubin Urine: NEGATIVE
Glucose, UA: NEGATIVE mg/dL
Ketones, ur: NEGATIVE mg/dL
Leukocytes,Ua: NEGATIVE
Nitrite: NEGATIVE
Protein, ur: NEGATIVE mg/dL
Specific Gravity, Urine: 1.006 (ref 1.005–1.030)
pH: 7.5 (ref 5.0–8.0)

## 2022-03-27 LAB — HEPATIC FUNCTION PANEL
ALT: 46 U/L — ABNORMAL HIGH (ref 0–44)
AST: 29 U/L (ref 15–41)
Albumin: 4.4 g/dL (ref 3.5–5.0)
Alkaline Phosphatase: 47 U/L (ref 38–126)
Bilirubin, Direct: 0.1 mg/dL (ref 0.0–0.2)
Indirect Bilirubin: 0.4 mg/dL (ref 0.3–0.9)
Total Bilirubin: 0.5 mg/dL (ref 0.3–1.2)
Total Protein: 7 g/dL (ref 6.5–8.1)

## 2022-03-27 LAB — CBC
HCT: 46.3 % (ref 39.0–52.0)
Hemoglobin: 15.4 g/dL (ref 13.0–17.0)
MCH: 29.1 pg (ref 26.0–34.0)
MCHC: 33.3 g/dL (ref 30.0–36.0)
MCV: 87.5 fL (ref 80.0–100.0)
Platelets: 207 10*3/uL (ref 150–400)
RBC: 5.29 MIL/uL (ref 4.22–5.81)
RDW: 13.2 % (ref 11.5–15.5)
WBC: 5.5 10*3/uL (ref 4.0–10.5)
nRBC: 0 % (ref 0.0–0.2)

## 2022-03-27 LAB — BASIC METABOLIC PANEL
Anion gap: 8 (ref 5–15)
BUN: 8 mg/dL (ref 6–20)
CO2: 27 mmol/L (ref 22–32)
Calcium: 9 mg/dL (ref 8.9–10.3)
Chloride: 106 mmol/L (ref 98–111)
Creatinine, Ser: 0.94 mg/dL (ref 0.61–1.24)
GFR, Estimated: >60 mL/min (ref >60)
Glucose, Bld: 114 mg/dL — ABNORMAL HIGH (ref 70–99)
Potassium: 3.9 mmol/L (ref 3.5–5.1)
Sodium: 141 mmol/L (ref 135–145)

## 2022-03-27 LAB — BRAIN NATRIURETIC PEPTIDE: B Natriuretic Peptide: 11.4 pg/mL (ref 0.0–100.0)

## 2022-03-27 MED ORDER — FUROSEMIDE 20 MG PO TABS: 20.0000 mg | ORAL_TABLET | Freq: Every day | ORAL | 0 refills | Status: DC

## 2022-03-27 NOTE — ED Notes (Signed)
Denies dizziness at this time ?

## 2022-03-27 NOTE — ED Provider Notes (Signed)
[facility] [facility] EMERGENCY DEPT Provider Note   CSN: [id number] Arrival date & time: 03/27/22  1439     History  Chief Complaint  Patient presents with   Leg Swelling    Sean [name] is a 51 y.o. male.  HPI  51 year old male with medical history significant for OSAemergency department with bilateral leg swelling.  The patient also endorses swelling in his hands bilaterally.  He has a history of infections in his hands and is worried that he has recurrent infection.  He denies any redness, pain, fevers or chills.  He denies any chest pain or shortness of breath.   Denies any history of heart failure.  He is not on a diuretic.  He denies any leg pain.  He denies any unilateral leg swelling.  Home Medications Prior to Admission medications   Medication Sig Start Date End Date Taking? Authorizing Provider  diazepam (VALIUM) 10 MG tablet Take 10 mg by mouth 3 (three) times daily. 02/14/22   [provider]  EPINEPHrine 0.3 mg/0.3 mL IJ SOAJ injection Inject 0.3 mg into the muscle as needed for anaphylaxis.    [provider]  escitalopram (LEXAPRO) 20 MG tablet Take 20 mg by mouth daily. 03/28/22   [provider]  famotidine (PEPCID) 20 MG tablet Take 20 mg by mouth daily as needed for heartburn or indigestion.    [provider]  furosemide (LASIX) 20 MG tablet Take 1 tablet (20 mg total) by mouth daily as needed for edema. 03/30/22 07/28/22  Croitoru, Mihai, MD  HYDROcodone-acetaminophen (NORCO) 10-325 MG tablet SMARTSIG:0.5-1 Pill By Mouth 3 Times Daily PRN 04/12/21   [provider]  ondansetron (ZOFRAN) 4 MG tablet Take 1 tablet (4 mg total) by mouth every 8 (eight) hours as needed for nausea or vomiting. 01/20/21   [name], MD      Allergies    Bee venom, Shellfish allergy, Alpha-gal, Gabapentin, and Hydromorphone hcl    Review of Systems   Review of Systems  Cardiovascular:  Positive for leg swelling.  All other  systems reviewed and are negative.  Physical Exam Updated Vital Signs BP (!) 114/55   Pulse (!) 58   Temp 97.7 F (36.5 C)   Resp 13   Ht '5\' 9"'$  (1.753 m)   Wt 117.9 kg   SpO2 96%   BMI 38.40 kg/m  Physical Exam Vitals and nursing note reviewed.  Constitutional:      General: He is not in acute distress.    Appearance: He is well-developed.  HENT:     Head: Normocephalic and atraumatic.  Eyes:     Conjunctiva/sclera: Conjunctivae normal.  Cardiovascular:     Rate and Rhythm: Normal rate and regular rhythm.     Heart sounds: No murmur heard. Pulmonary:     Effort: Pulmonary effort is normal. No respiratory distress.     Breath sounds: Normal breath sounds.  Abdominal:     Palpations: Abdomen is soft.     Tenderness: There is no abdominal tenderness.  Musculoskeletal:        General: No swelling.     Cervical back: Neck supple.     Right lower leg: Edema present.     Left lower leg: Edema present.     Comments: Trace bilateral pitting edema lower extremities, 2+ distal pulses.  Trace bilateral swelling of the hands with no fluctuance, erythema, tenderness.  Skin:    General: Skin is warm and dry.     Capillary Refill:  Capillary refill takes less than 2 seconds.  Neurological:     Mental Status: He is alert.  Psychiatric:        Mood and Affect: Mood normal.    ED Results / Procedures / Treatments   Labs (all labs ordered are listed, but only abnormal results are displayed) Labs Reviewed  BASIC METABOLIC PANEL - Abnormal; Notable for the following components:      Result Value   Glucose, Bld 114 (*)    All other components within normal limits  HEPATIC FUNCTION PANEL - Abnormal; Notable for the following components:   ALT 46 (*)    All other components within normal limits  URINALYSIS, ROUTINE W REFLEX MICROSCOPIC - Abnormal; Notable for the following components:   Hgb urine dipstick LARGE (*)    All other components within normal limits  CBC  BRAIN  NATRIURETIC PEPTIDE    EKG EKG Interpretation  Date/Time:  Monday Mar 27 2022 15:23:48 EDT Ventricular Rate:  97 PR Interval:  154 QRS Duration: 102 QT Interval:  352 QTC Calculation: 447 R Axis:   80 Text Interpretation: Normal sinus rhythm Low voltage QRS Borderline ECG When compared with ECG of 15-Sep-2020 14:21, PREVIOUS ECG IS PRESENT Confirmed by Regan Lemming ([id number]) on 03/27/2022 3:31:20 PM  Radiology No results found.  Procedures Procedures    Medications Ordered in ED Medications - No data to display  ED Course/ Medical Decision Making/ A&P                           Medical Decision Making Amount and/or Complexity of Data Reviewed Labs: ordered. Radiology: ordered.    51 year old male with medical history significant for OSAemergency department with bilateral leg swelling.  The patient also endorses swelling in his hands bilaterally.  He has a history of infections in his hands and is worried that he has recurrent infection.  He denies any redness, pain, fevers or chills.  He denies any chest pain or shortness of breath.   Denies any history of heart failure.  He is not on a diuretic.  He denies any leg pain.  He denies any unilateral leg swelling.  On arrival, the patient was afebrile, vitally stable.  NSR noted on cardiac telemetry.  Physical exam significant for bilateral lower extremity trace pitting edema with trace edema of the hands bilaterally with no evidence for cellulitis or abscess.  No chest pain or shortness of breath.  Lungs clear to auscultation bilaterally.  Low concern for PE or DVT as there is no unilateral nature to the patient's discomfort.   He is not tachycardic or tachypneic.  Considered new onset heart failure although no JVD and lungs clear to auscultation bilaterally.  Low concern for ACS.  Patient without chest discomfort at this time.  No anginal symptoms.  Low concern for PE.  EKG performed significant for sinus rhythm, ventricular rate 97,  low voltage QRS, no ischemic changes present, no changes to prior.  Chest x-ray was performed, reviewed and interpreted by myself and radiology negative for active disease.  Laboratory work-up significant for urinalysis with large hemoglobin, if negative for UTI.  The patient denies any flank pain or abdominal pain at this time.  Low concern for nephrolithiasis.  CBC without leukocytosis or anemia.  BMP with no significant electrolyte abnormality, normal renal function, hepatic function panel with mild elevation in ALT to 46, otherwise unremarkable.  BNP normal.  I informed patient of his work-up  at this time.  Patient does not require admission to the hospital at this time.  For further medical screening evaluation and work-up at this time based on initial presentation.  Patient could benefit from outpatient echocardiogram to evaluate function further.  We will place the patient on a short course of Lasix and have the patient follow-up with his PCP with a potential referral to cardiology.  Additionally, I advised that the patient follow-up with his PCP for recheck of urine hematuria today.  Overall stable at time of discharge.  DC Instructions: Your EKG, chest x-ray and laboratory work-up was largely reassuring.  You likely could benefit from an outpatient echocardiogram to evaluate the function of your heart.  There is no indication for inpatient hospitalization work-up for this.  Recommend you follow-up with your PCP to coordinate echocardiogram and potential referral to cardiology.  We will start you on a short course of Lasix for 5 days.  Additionally recommend compression stockings bilaterally.   Final Clinical Impression(s) / ED Diagnoses Final diagnoses:  Bilateral lower extremity edema    Rx / DC Orders ED Discharge Orders          Ordered    furosemide (LASIX) 20 MG tablet  Daily,   Status:  Discontinued        03/27/22 1746              Regan Lemming, MD 03/30/22 2157

## 2022-03-27 NOTE — Discharge Instructions (Addendum)
Your EKG, chest x-ray and laboratory work-up was largely reassuring.  You likely could benefit from an outpatient echocardiogram to evaluate the function of your heart.  There is no indication for inpatient hospitalization work-up for this.  Recommend you follow-up with your PCP to coordinate echocardiogram and potential referral to cardiology.  We will start you on a short course of Lasix for 5 days.  Additionally recommend compression stockings bilaterally.

## 2022-03-27 NOTE — ED Triage Notes (Signed)
Patient reports to the ER for x4 days swelling to the legs bilaterally. Patient also reports he has had small abscesses coming up on his hands. Patient denies feeling short of breath. Patient also reports a small rash on his left bicep.  ?

## 2022-03-27 NOTE — ED Notes (Signed)
[place] - RN aware of pt's BP ?

## 2022-03-27 NOTE — ED Notes (Signed)
Staff informs this RN of low BP. Upon entering, noted pt alert, oriented x4 , with appropriate skin color, and 2+ radial pulses. BP cuff changed and BP normal with recheck. ?

## 2022-03-27 NOTE — ED Notes (Signed)
Patient verbalizes understanding of discharge instructions. Opportunity for questioning and answers were provided. Armband removed by staff, pt discharged from ED withfamily to home ?

## 2022-03-28 ENCOUNTER — Telehealth: Payer: Self-pay

## 2022-03-28 NOTE — Telephone Encounter (Signed)
Received a call from patient's wife requesting appointment with Dr.Croitoru.Stated husband has been having swelling in lower legs and feet. No sob.No chest pain.Stated she took him to [facility] ED yesterday afternoon and Dr.told him he needed a heart ultrasound.Stated she is a patient of Dr.Croitoru's and she requested appointment with Dr.Croitoru.Appointment scheduled with Dr.Croitoru 5/18 at 3:40 pm. ?

## 2022-03-30 ENCOUNTER — Encounter: Payer: Self-pay | Admitting: Cardiovascular Disease

## 2022-03-30 ENCOUNTER — Ambulatory Visit (INDEPENDENT_AMBULATORY_CARE_PROVIDER_SITE_OTHER): Payer: BLUE CROSS/BLUE SHIELD | Admitting: Cardiovascular Disease

## 2022-03-30 ENCOUNTER — Ambulatory Visit: Payer: BLUE CROSS/BLUE SHIELD | Admitting: Cardiovascular Disease

## 2022-03-30 VITALS — BP 132/78 | HR 78 | Ht 69.0 in | Wt 267.0 lb

## 2022-03-30 DIAGNOSIS — N200 Calculus of kidney: Secondary | ICD-10-CM | POA: Diagnosis not present

## 2022-03-30 DIAGNOSIS — R600 Localized edema: Secondary | ICD-10-CM

## 2022-03-30 DIAGNOSIS — Z8547 Personal history of malignant neoplasm of testis: Secondary | ICD-10-CM

## 2022-03-30 DIAGNOSIS — G4733 Obstructive sleep apnea (adult) (pediatric): Secondary | ICD-10-CM

## 2022-03-30 MED ORDER — FUROSEMIDE 20 MG PO TABS
20.0000 mg | ORAL_TABLET | Freq: Every day | ORAL | 3 refills | Status: DC | PRN
Start: 2022-03-30 — End: 2022-06-19

## 2022-03-30 NOTE — Patient Instructions (Signed)
Medication Instructions:  FUROSEMIDE: Take 20 mg once daily as needed for swelling  *If you need a refill on your cardiac medications before your next appointment, please call your pharmacy*   Lab Work: None ordered If you have labs (blood work) drawn today and your tests are completely normal, you will receive your results only by: [place] (if you have MyChart) OR A paper copy in the mail If you have any lab test that is abnormal or we need to change your treatment, we will call you to review the results.   Testing/Procedures: Your physician has requested that you have an echocardiogram. Echocardiography is a painless test that uses sound waves to create images of your heart. It provides your doctor with information about the size and shape of your heart and how well your heart's chambers and valves are working. You may receive an ultrasound enhancing agent through an IV if needed to better visualize your heart during the echo.This procedure takes approximately one hour. There are no restrictions for this procedure. This will take place at the [address].   Dr. Sallyanne Kuster would like for you to have a Venous Reflux-Lower Extremity Doppler. This is done at [place], [address].   Follow-Up: At [facility], you and your health needs are our priority.  As part of our continuing mission to provide you with exceptional heart care, we have created designated Provider Care Teams.  These Care Teams include your primary Cardiologist (physician) and Advanced Practice Providers (APPs -  Physician Assistants and Nurse Practitioners) who all work together to provide you with the care you need, when you need it.  We recommend signing up for the patient portal called "MyChart".  Sign up information is provided on this After Visit Summary.  MyChart is used to connect with patients for Virtual Visits (Telemedicine).  Patients are able to view lab/test results, encounter  notes, upcoming appointments, etc.  Non-urgent messages can be sent to your provider as well.   To learn more about what you can do with MyChart, go to [email or web address].    Your next appointment:   4 month(s)  The format for your next appointment:   In Person  Provider:   Sanda Klein, MD {   Important Information About Sugar

## 2022-03-30 NOTE — Progress Notes (Signed)
Cardiology Office Note:    Date:  04/02/2022   ID:  Sean [name], DOB August 13, 1971, MRN [id number]  PCP:  [name], MD   [place] Providers Cardiologist:  Sanda Klein, MD     Referring MD: [name], MD   Chief Complaint  Patient presents with   Consult  Sean [name] is a 51 y.o. male who is being seen today for the evaluation of edema and fatigue at the request of [name], MD.   History of Present Illness:    Sean [name] is a 51 y.o. 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  Following an injury to his cervical spine he has been less physically active and has gained a lot of weight.  He developed edema more than a week ago.  In his ankles and feet.  He has not had leg pain, cough or hemoptysis.  The edema is symmetrical he was seen in the emergency room on May 15 and was prescribed furosemide.  He took furosemide for 5 days with partial improvement.  He does not have orthopnea or PND and does not appear to have exertional dyspnea, although he has recently been very sedentary.  He denies chest pain, but has pain that radiates down his right shoulder and arm.  He does not have palpitations and denies syncope.  Work-up in the emergency room included ECG, chest x-ray and routine labs that were all within normal limits, including a very low BNP (11.4) and normal creatinine (0.94).  His liver function tests showed a borderline ALT of 46 (ULN 44), but was otherwise normal.  Albumin was 4.4.  Urinalysis did show  hematuria, but no proteinuria.  About 5 years ago he had a sleep study at [facility] that was deemed to be "borderline".  He has gained a lot of weight since then.  He does have symptoms of poor quality sleep, feeling tired and some daytime  hypersomnolence, but does not fall asleep but while being a passenger in a car or when watching a movie.  He has gained 30 pounds since 2013.  He had ACDF C4-C6 in March 2022.  He has a long history of recurrent nephrolithiasis, most recently had a right ureteral stone requiring stent placement in May 2016 and a left ureteral stone requiring laser stone fragmentation and stent placement in August 2016.  Has a remote history of left testicular cancer in 2003.  Past Medical History:  Diagnosis Date   Cancer [facility]) 2003   testicular   History of kidney stones    Sleep apnea    pt states it was very mild and does not have to wear CPAP    Past Surgical History:  Procedure Laterality Date   ANTERIOR CERVICAL DECOMP/DISCECTOMY FUSION N/A 01/20/2021   Procedure: ANTERIOR CERVICAL DECOMPRESSION/DISCECTOMY FUSION 2 LEVELS CERVICAL FOUR-SIX;  Surgeon: [name], MD;  Location: [place];  Service: Orthopedics;  Laterality: N/A;  3 hrs   CYSTOSCOPY W/ URETERAL STENT PLACEMENT Right 03/25/2015   Procedure: CYSTOSCOPY, RETROGRADE PYELOGRAM  WITH RIGHT URETERAL STENT PLACEMENT;  Surgeon: [name], MD;  Location: [facility];  Service: Urology;  Laterality: Right;   CYSTOSCOPY WITH RETROGRADE PYELOGRAM, URETEROSCOPY AND STENT PLACEMENT Left 07/04/2015   Procedure: CYSTOSCOPY WITH RETROGRADE PYELOGRAM, URETEROSCOPY AND STENT PLACEMENT;  Surgeon: [name], MD;  Location: [facility];  Service: Urology;  Laterality: Left;  With Holmium Laser   SURGERY SCROTAL / TESTICULAR  2003   for testicular cancer    Current Medications: Current Meds  Medication Sig   diazepam (VALIUM) 10 MG tablet Take 10 mg by mouth 3 (three) times daily.   EPINEPHrine 0.3 mg/0.3 mL IJ SOAJ injection Inject 0.3 mg into the muscle as needed for anaphylaxis.   escitalopram (LEXAPRO) 20 MG tablet Take 20 mg by mouth daily.   famotidine (PEPCID) 20 MG tablet Take 20 mg by mouth daily as needed for heartburn or indigestion.    HYDROcodone-acetaminophen (NORCO) 10-325 MG tablet SMARTSIG:0.5-1 Pill By Mouth 3 Times Daily PRN   ondansetron (ZOFRAN) 4 MG tablet Take 1 tablet (4 mg total) by mouth every 8 (eight) hours as needed for nausea or vomiting.   [DISCONTINUED] furosemide (LASIX) 20 MG tablet Take 1 tablet (20 mg total) by mouth daily for 5 days.     Allergies:   Bee venom, Shellfish allergy, Alpha-gal, Gabapentin, and Hydromorphone hcl   Social History   Socioeconomic History   Marital status: Married    Spouse name: Not on file   Number of children: Not on file   Years of education: Not on file   Highest education level: Not on file  Occupational History   Not on file  Tobacco Use   Smoking status: Former   Smokeless tobacco: Current    Types: Chew  Vaping Use   Vaping Use: Never used  Substance and Sexual Activity   Alcohol use: No   Drug use: No   Sexual activity: Not on file  Other Topics Concern   Not on file  Social History Narrative   Not on file   Social Determinants of Health   Financial Resource Strain: Not on file  Food Insecurity: Not on file  Transportation Needs: Not on file  Physical Activity: Not on file  Stress: Not on file  Social Connections: Not on file     Family History: The patient's family negative for early onset coronary disease or other cardiovascular problems.  There is a family history of cancer, diabetes mellitus and arthritis.  ROS:   Please see the history of present illness.    He has a lot of problems with anxiety and depression related to the chronic pain from his cervical spine disease.  All other systems reviewed and are negative.  EKGs/Labs/Other Studies Reviewed:    The following studies were reviewed today: Notes from recent ER visit including labs, ECG and chest x-ray.   Stress echocardiogram 08/16/2010   - Peak stress: Normal reduction in LVEDD with increase in LVEF to     65-70%. Definity contrast given to improve wall visualization.      There are no obvious stress induced wall motion abnormalities.   - Baseline: LVEF 55-60%. No rest wall motion abnormalities.   - Stress: Baseline EKG: Sinus Rhythm at 65. Stress EKG: Sinus     tachycardia with a few PVC's. No stress induced ischemic EKG     changes  EKG:  EKG is not ordered today.  The ekg ordered 03/27/2022 demonstrates normal sinus rhythm, minor IVCD (QRS 102 ms, incomplete right bundle branch block), no repolarization normalities, otherwise normal tracing.  Normal QTc 447 ms.  Recent Labs: 03/27/2022: ALT 46; B Natriuretic Peptide 11.4; BUN 8; Creatinine, Ser 0.94; Hemoglobin 15.4; Platelets 207; Potassium 3.9; Sodium 141  Recent Lipid Panel No results found for: CHOL, TRIG, HDL, CHOLHDL, VLDL,  LDLCALC, LDLDIRECT 03/15/2021 Cholesterol 200, HDL 50, LDL 128, triglycerides 123, hemoglobin A1c 6.0%  Risk Assessment/Calculations:           Physical Exam:    VS:  BP 132/78   Pulse 78   Ht '5\' 9"'$  (1.753 m)   Wt 267 lb (121.1 kg)   SpO2 97%   BMI 39.43 kg/m     Wt Readings from Last 3 Encounters:  03/30/22 267 lb (121.1 kg)  03/27/22 260 lb (117.9 kg)  01/20/21 277 lb (125.6 kg)     GEN: Severely obese, well nourished, well developed in no acute distress HEENT: Normal NECK: No JVD; No carotid bruits LYMPHATICS: No lymphadenopathy CARDIAC: RRR, no murmurs, rubs, gallops RESPIRATORY:  Clear to auscultation without rales, wheezing or rhonchi  ABDOMEN: Soft, non-tender, non-distended MUSCULOSKELETAL:  No edema; No deformity  SKIN: Warm and dry NEUROLOGIC:  Alert and oriented x 3 PSYCHIATRIC:  Normal affect   ASSESSMENT:    1. Edema, lower extremity   2. OSA (obstructive sleep apnea)   3. Severe obesity (BMI 35.0-39.9) with comorbidity ([place])   4. Nephrolithiasis   5. History of testicular cancer    PLAN:    In order of problems listed above:  Edema: I suspect this is primarily due to venous insufficiency.  He has a few varicose veins visible on  his legs and he has gained a lot of weight, bordering on morbid obesity.  Cannot exclude a component of right heart failure due to untreated sleep apnea, that again could be worsened by his significant weight gain.  We will check an echocardiogram and a lower extremity venous Doppler study geared to look for venous reflux.  Note that he did not have any evidence of superficial or deep venous thrombosis on a duplex ultrasound performed in 2019, but evaluation for reflux was not performed. Sleep apnea: This was reportedly mild on test performed about 3 years ago, but he has gained a lot of weight since then and now borders of morbid obesity.  We will check an echocardiogram.  If there is any evidence of right heart enlargement or elevated PA pressure, he should have a repeat sleep study.  Okay to continue low-dose of loop diuretic until we clarify the cause for his swelling. Morbid obesity: Problems are compounded by inability to exercise, chronic pain, depression and anxiety.  Strongly encourage weight loss.  Diet is a critical part of this, including reduction in intake of sugars and starches with high glycemic index and saturated fat and increasing the intake of unsaturated fat and lean protein. Recurrent nephrolithiasis Remote history of testicular cancer           Medication Adjustments/Labs and Tests Ordered: Current medicines are reviewed at length with the patient today.  Concerns regarding medicines are outlined above.  Orders Placed This Encounter  Procedures   ECHOCARDIOGRAM COMPLETE   VAS [place] LOWER EXTREMITY VENOUS REFLUX   Meds ordered this encounter  Medications   furosemide (LASIX) 20 MG tablet    Sig: Take 1 tablet (20 mg total) by mouth daily as needed for edema.    Dispense:  30 tablet    Refill:  3    Patient Instructions  Medication Instructions:  FUROSEMIDE: Take 20 mg once daily as needed for swelling  *If you need a refill on your cardiac medications before your next  appointment, please call your pharmacy*   Lab Work: None ordered If you have labs (blood work) drawn today and your tests  are completely normal, you will receive your results only by: MyChart [place] (if you have MyChart) OR A paper copy in the mail If you have any lab test that is abnormal or we need to change your treatment, we will call you to review the results.   Testing/Procedures: Your physician has requested that you have an echocardiogram. Echocardiography is a painless test that uses sound waves to create images of your heart. It provides your doctor with information about the size and shape of your heart and how well your heart's chambers and valves are working. You may receive an ultrasound enhancing agent through an IV if needed to better visualize your heart during the echo.This procedure takes approximately one hour. There are no restrictions for this procedure. This will take place at the [address].   Dr. Sallyanne Kuster would like for you to have a Venous Reflux-Lower Extremity Doppler. This is done at [place], [address].   Follow-Up: At [facility], you and your health needs are our priority.  As part of our continuing mission to provide you with exceptional heart care, we have created designated Provider Care Teams.  These Care Teams include your primary Cardiologist (physician) and Advanced Practice Providers (APPs -  Physician Assistants and Nurse Practitioners) who all work together to provide you with the care you need, when you need it.  We recommend signing up for the patient portal called "MyChart".  Sign up information is provided on this After Visit Summary.  MyChart is used to connect with patients for Virtual Visits (Telemedicine).  Patients are able to view lab/test results, encounter notes, upcoming appointments, etc.  Non-urgent messages can be sent to your provider as well.   To learn more about what you can do with MyChart, go to  [email or web address].    Your next appointment:   4 month(s)  The format for your next appointment:   In Person  Provider:   Sanda Klein, MD {   Important Information About Sugar         Signed, Sanda Klein, MD  04/02/2022 9:29 PM    [place]

## 2022-04-02 ENCOUNTER — Encounter: Payer: Self-pay | Admitting: Cardiovascular Disease

## 2022-04-03 ENCOUNTER — Encounter: Payer: Self-pay | Admitting: *Deleted

## 2022-04-03 ENCOUNTER — Ambulatory Visit (HOSPITAL_COMMUNITY)
Admission: RE | Admit: 2022-04-03 | Discharge: 2022-04-03 | Disposition: A | Discharge status: Home / Self Care | Payer: BLUE CROSS/BLUE SHIELD | Source: Ambulatory Visit | Attending: Cardiovascular Disease | Admitting: Cardiovascular Disease

## 2022-04-03 DIAGNOSIS — R600 Localized edema: Secondary | ICD-10-CM | POA: Diagnosis present

## 2022-04-16 ENCOUNTER — Other Ambulatory Visit: Payer: Self-pay

## 2022-04-16 ENCOUNTER — Emergency Department (HOSPITAL_COMMUNITY)
Admission: EM | Admit: 2022-04-16 | Discharge: 2022-04-16 | Disposition: A | Discharge status: Home / Self Care | Payer: BLUE CROSS/BLUE SHIELD | Attending: Emergency Medicine | Admitting: Emergency Medicine

## 2022-04-16 ENCOUNTER — Encounter (HOSPITAL_COMMUNITY): Payer: Self-pay

## 2022-04-16 ENCOUNTER — Emergency Department (HOSPITAL_COMMUNITY): Payer: BLUE CROSS/BLUE SHIELD

## 2022-04-16 DIAGNOSIS — R109 Unspecified abdominal pain: Secondary | ICD-10-CM

## 2022-04-16 DIAGNOSIS — N132 Hydronephrosis with renal and ureteral calculous obstruction: Secondary | ICD-10-CM | POA: Diagnosis not present

## 2022-04-16 DIAGNOSIS — R944 Abnormal results of kidney function studies: Secondary | ICD-10-CM | POA: Insufficient documentation

## 2022-04-16 DIAGNOSIS — R7401 Elevation of levels of liver transaminase levels: Secondary | ICD-10-CM | POA: Diagnosis not present

## 2022-04-16 DIAGNOSIS — R112 Nausea with vomiting, unspecified: Secondary | ICD-10-CM

## 2022-04-16 DIAGNOSIS — N201 Calculus of ureter: Secondary | ICD-10-CM

## 2022-04-16 LAB — URINALYSIS, ROUTINE W REFLEX MICROSCOPIC
Bilirubin Urine: NEGATIVE
Glucose, UA: NEGATIVE mg/dL
Ketones, ur: NEGATIVE mg/dL
Leukocytes,Ua: NEGATIVE
Nitrite: NEGATIVE
Protein, ur: 30 mg/dL — AB
RBC / HPF: >50 RBC/hpf — ABNORMAL HIGH (ref 0–5)
Specific Gravity, Urine: 1.016 (ref 1.005–1.030)
pH: 6 (ref 5.0–8.0)

## 2022-04-16 LAB — CBC WITH DIFFERENTIAL/PLATELET
Abs Immature Granulocytes: 0.03 10*3/uL (ref 0.00–0.07)
Basophils Absolute: 0.1 10*3/uL (ref 0.0–0.1)
Basophils Relative: 1 %
Eosinophils Absolute: 0 10*3/uL (ref 0.0–0.5)
Eosinophils Relative: 0 %
HCT: 48.2 % (ref 39.0–52.0)
Hemoglobin: 15.9 g/dL (ref 13.0–17.0)
Immature Granulocytes: 0 %
Lymphocytes Relative: 13 %
Lymphs Abs: 1.3 10*3/uL (ref 0.7–4.0)
MCH: 29.4 pg (ref 26.0–34.0)
MCHC: 33 g/dL (ref 30.0–36.0)
MCV: 89.3 fL (ref 80.0–100.0)
Monocytes Absolute: 0.7 10*3/uL (ref 0.1–1.0)
Monocytes Relative: 7 %
Neutro Abs: 8.2 10*3/uL — ABNORMAL HIGH (ref 1.7–7.7)
Neutrophils Relative %: 79 %
Platelets: 198 10*3/uL (ref 150–400)
RBC: 5.4 MIL/uL (ref 4.22–5.81)
RDW: 13.2 % (ref 11.5–15.5)
WBC: 10.3 10*3/uL (ref 4.0–10.5)
nRBC: 0 % (ref 0.0–0.2)

## 2022-04-16 LAB — HEPATIC FUNCTION PANEL
ALT: 51 U/L — ABNORMAL HIGH (ref 0–44)
AST: 33 U/L (ref 15–41)
Albumin: 4.2 g/dL (ref 3.5–5.0)
Alkaline Phosphatase: 54 U/L (ref 38–126)
Bilirubin, Direct: 0.1 mg/dL (ref 0.0–0.2)
Indirect Bilirubin: 0.5 mg/dL (ref 0.3–0.9)
Total Bilirubin: 0.6 mg/dL (ref 0.3–1.2)
Total Protein: 7.7 g/dL (ref 6.5–8.1)

## 2022-04-16 LAB — BASIC METABOLIC PANEL
Anion gap: 9 (ref 5–15)
BUN: 15 mg/dL (ref 6–20)
CO2: 26 mmol/L (ref 22–32)
Calcium: 9.1 mg/dL (ref 8.9–10.3)
Chloride: 105 mmol/L (ref 98–111)
Creatinine, Ser: 1.36 mg/dL — ABNORMAL HIGH (ref 0.61–1.24)
GFR, Estimated: >60 mL/min (ref >60)
Glucose, Bld: 127 mg/dL — ABNORMAL HIGH (ref 70–99)
Potassium: 3.9 mmol/L (ref 3.5–5.1)
Sodium: 140 mmol/L (ref 135–145)

## 2022-04-16 LAB — LIPASE, BLOOD: Lipase: 33 U/L (ref 11–51)

## 2022-04-16 MED ORDER — SODIUM CHLORIDE 0.9 % IV BOLUS
1000.0000 mL | Freq: Once | INTRAVENOUS | Status: AC
  Administered 2022-04-16: 1000 mL via INTRAVENOUS

## 2022-04-16 MED ORDER — ONDANSETRON 4 MG PO TBDP
4.0000 mg | ORAL_TABLET | Freq: Once | ORAL | Status: AC
  Administered 2022-04-16: 4 mg via ORAL
  Filled 2022-04-16: qty 1

## 2022-04-16 MED ORDER — FENTANYL CITRATE PF 50 MCG/ML IJ SOSY
25.0000 ug | PREFILLED_SYRINGE | Freq: Once | INTRAMUSCULAR | Status: AC
  Administered 2022-04-16: 25 ug via INTRAVENOUS
  Filled 2022-04-16: qty 1

## 2022-04-16 MED ORDER — ONDANSETRON 4 MG PO TBDP: 4.0000 mg | ORAL_TABLET | Freq: Three times a day (TID) | ORAL | 0 refills | Status: DC | PRN

## 2022-04-16 MED ORDER — KETOROLAC TROMETHAMINE 15 MG/ML IJ SOLN
15.0000 mg | Freq: Once | INTRAMUSCULAR | Status: AC
  Administered 2022-04-16: 15 mg via INTRAMUSCULAR
  Filled 2022-04-16: qty 1

## 2022-04-16 MED ORDER — OXYCODONE-ACETAMINOPHEN 5-325 MG PO TABS: 1.0000 | ORAL_TABLET | Freq: Four times a day (QID) | ORAL | 0 refills | Status: DC | PRN

## 2022-04-16 NOTE — ED Notes (Signed)
Unable to collect labs at this time patient is gone to [facility]

## 2022-04-16 NOTE — ED Provider Notes (Signed)
[place] DEPT Provider Note   CSN: [id number] Arrival date & time: 04/16/22  1116     History Chief Complaint  Patient presents with   Flank Pain   Emesis   Urinary Retention    Sean [name] is a 51 y.o. male with history of kidney stones who presents to the emergency department with left-sided flank pain that started around 2 AM this morning.  He states he has been having intractable nausea and vomiting.  He denies any urinary complaints including dysuria, hematuria, frequency, or urgency.  Pain does not radiate and he describes it as a sharp sensation.  No fevers or chills.  Flank Pain  Emesis     Home Medications Prior to Admission medications   Medication Sig Start Date End Date Taking? Authorizing Provider  ondansetron (ZOFRAN-ODT) 4 MG disintegrating tablet Take 1 tablet (4 mg total) by mouth every 8 (eight) hours as needed for nausea or vomiting. 04/16/22  Yes Raul Del, Raenette Sakata M, PA-C  oxyCODONE-acetaminophen (PERCOCET/ROXICET) 5-325 MG tablet Take 1-2 tablets by mouth every 6 (six) hours as needed for severe pain. 04/16/22  Yes Monta Maiorana M, PA-C  diazepam (VALIUM) 10 MG tablet Take 10 mg by mouth 3 (three) times daily. 02/14/22   [provider]  EPINEPHrine 0.3 mg/0.3 mL IJ SOAJ injection Inject 0.3 mg into the muscle as needed for anaphylaxis.    [provider]  escitalopram (LEXAPRO) 20 MG tablet Take 20 mg by mouth daily. 03/28/22   [provider]  famotidine (PEPCID) 20 MG tablet Take 20 mg by mouth daily as needed for heartburn or indigestion.    [provider]  furosemide (LASIX) 20 MG tablet Take 1 tablet (20 mg total) by mouth daily as needed for edema. 03/30/22 07/28/22  Croitoru, Mihai, MD      Allergies    Bee venom, Shellfish allergy, Alpha-gal, Gabapentin, and Hydromorphone hcl    Review of Systems   Review of Systems  Gastrointestinal:  Positive for vomiting.  Genitourinary:   Positive for flank pain.  All other systems reviewed and are negative.  Physical Exam Updated Vital Signs BP (!) 160/77   Pulse 60   Temp 98.6 F (37 C) (Oral)   Resp 18   Ht '5\' 9"'$  (1.753 m)   Wt 117.9 kg   SpO2 93%   BMI 38.40 kg/m  Physical Exam Vitals and nursing note reviewed.  Constitutional:      General: He is not in acute distress.    Appearance: Normal appearance.  HENT:     Head: Normocephalic and atraumatic.  Eyes:     General:        Right eye: No discharge.        Left eye: No discharge.  Cardiovascular:     Comments: Regular rate and rhythm.  S1/S2 are distinct without any evidence of murmur, rubs, or gallops.  Radial pulses are 2+ bilaterally.  Dorsalis pedis pulses are 2+ bilaterally.  No evidence of pedal edema. Pulmonary:     Comments: Clear to auscultation bilaterally.  Normal effort.  No respiratory distress.  No evidence of wheezes, rales, or rhonchi heard throughout. Abdominal:     General: Abdomen is flat. Bowel sounds are normal. There is no distension.     Tenderness: There is no abdominal tenderness. There is left CVA tenderness. There is no guarding or rebound.  Musculoskeletal:        General: Normal range of motion.  Cervical back: Neck supple.  Skin:    General: Skin is warm and dry.     Findings: No rash.  Neurological:     General: No focal deficit present.     Mental Status: He is alert.  Psychiatric:        Mood and Affect: Mood normal.        Behavior: Behavior normal.    ED Results / Procedures / Treatments   Labs (all labs ordered are listed, but only abnormal results are displayed) Labs Reviewed  URINALYSIS, ROUTINE W REFLEX MICROSCOPIC - Abnormal; Notable for the following components:      Result Value   Hgb urine dipstick LARGE (*)    Protein, ur 30 (*)    RBC / HPF >50 (*)    Bacteria, UA RARE (*)    All other components within normal limits  BASIC METABOLIC PANEL - Abnormal; Notable for the following components:    Glucose, Bld 127 (*)    Creatinine, Ser 1.36 (*)    All other components within normal limits  CBC WITH DIFFERENTIAL/PLATELET - Abnormal; Notable for the following components:   Neutro Abs 8.2 (*)    All other components within normal limits  HEPATIC FUNCTION PANEL - Abnormal; Notable for the following components:   ALT 51 (*)    All other components within normal limits  LIPASE, BLOOD    EKG None  Radiology CT Renal Stone Study  Result Date: 04/16/2022 CLINICAL DATA:  Left flank pain. EXAM: CT ABDOMEN AND PELVIS WITHOUT CONTRAST TECHNIQUE: Multidetector CT imaging of the abdomen and pelvis was performed following the standard protocol without IV contrast. RADIATION DOSE REDUCTION: This exam was performed according to the departmental dose-optimization program which includes automated exposure control, adjustment of the mA and/or kV according to patient size and/or use of iterative reconstruction technique. COMPARISON:  July 04, 2015 FINDINGS: Lower chest: No acute abnormality. Hepatobiliary: No focal liver abnormality is seen. Status post cholecystectomy. No biliary dilatation. Pancreas: Unremarkable. No pancreatic ductal dilatation or surrounding inflammatory changes. Spleen: Normal in size without focal abnormality. Adrenals/Urinary Tract: There is a single 3 or 4 mm stone in the mid right kidney. The right kidney is otherwise normal in appearance. The right ureter is normal. No stones are identified in the left kidney. However, there is mild hydronephrosis and perinephric stranding. There is proximal left ureterectasis extending to the level of a 6.5 mm stone in the mid ureter. The bladder is poorly distended but unremarkable. Stomach/Bowel: Stomach is within normal limits. Appendix appears normal. No evidence of bowel wall thickening, distention, or inflammatory changes. Vascular/Lymphatic: No significant vascular findings are present. No enlarged abdominal or pelvic lymph nodes.  Reproductive: Prostate is unremarkable. Other: No abdominal wall hernia or abnormality. No abdominopelvic ascites. Musculoskeletal: No acute or significant osseous findings. IMPRESSION: 1. There is a 6.5 mm stone in the mid left ureter resulting in proximal ureterectasis, mild left hydronephrosis, and left perinephric stranding. 2. 3 or 4 mm nonobstructive stone in the mid right kidney. 3. No other abnormalities. Electronically Signed   By: Dorise Bullion III M.D.   On: 04/16/2022 12:11    Procedures Procedures    Medications Ordered in ED Medications  ondansetron (ZOFRAN-ODT) disintegrating tablet 4 mg (4 mg Oral Given 04/16/22 1135)  ketorolac (TORADOL) 15 MG/ML injection 15 mg (15 mg Intramuscular Given 04/16/22 1134)  sodium chloride 0.9 % bolus 1,000 mL (0 mLs Intravenous Stopped 04/16/22 1441)  fentaNYL (SUBLIMAZE) injection 25 mcg (25 mcg  Intravenous Given 04/16/22 1332)  fentaNYL (SUBLIMAZE) injection 25 mcg (25 mcg Intravenous Given 04/16/22 1441)    ED Course/ Medical Decision Making/ A&P Clinical Course as of 04/16/22 1442  Sun Apr 16, 2022  1349 On reevaluation, patient is still complaining of some pain.  He does state that his urologist is Dr. Diona Fanti.  Notified patient and spouse on labs and imaging. [CF]  1350 CBC with Differential(!) No evidence of leukocytosis or anemia. [CF]  9628 Basic metabolic panel(!) There is evidence of elevated creatinine in comparison to the previous. [CF]  1351 Hepatic function panel(!) ALT is slightly elevated.  Everything else otherwise normal. [CF]  1351 Urinalysis, Routine w reflex microscopic(!) Large amount of blood in the urine. [CF]  1418 I spoke with Dr. Diona Fanti who recommends getting his pain controlled sending him home with pain medication and he will follow-up in the office tomorrow. [CF]    Clinical Course User Index [CF] Hendricks Limes, PA-C                           Medical Decision Making RINALDO MACQUEEN is a 51 y.o. male  who presents to the emergency department today for further evaluation of flank pain.  Differential diagnosis includes pyelonephritis, kidney stone, referred pain from cystitis, pancreatitis, diverticulitis.  I evaluated this patient in triage and immediately ordered antiemetics in addition to pain medication.   Amount and/or Complexity of Data Reviewed External Data Reviewed: radiology.    Details: I reviewed a previous CT scan which showed ureterolithiasis. Labs: ordered. Decision-making details documented in ED Course. Radiology: ordered and independent interpretation performed.    Details: I personally ordered and interpreted a CT renal stone study which showed a 6.5 mm stone in the left ureter.  Risk Prescription drug management. Parenteral controlled substances. Risk Details: Given the clinical scenario, we will plan to discharge the patient home with pain medication and antiemetics.  He is tolerated p.o. since being here.  He was currently resting on reevaluation.  I discussed the findings and results with the spouse at the bedside.  Spoke with his urologist and they will follow-up with him tomorrow.  Strict return precautions were discussed.  He is safe for discharge at this time.   Final Clinical Impression(s) / ED Diagnoses Final diagnoses:  Ureterolithiasis  Flank pain  Nausea and vomiting, unspecified vomiting type    Rx / DC Orders ED Discharge Orders          Ordered    oxyCODONE-acetaminophen (PERCOCET/ROXICET) 5-325 MG tablet  Every 6 hours PRN        04/16/22 1437    ondansetron (ZOFRAN-ODT) 4 MG disintegrating tablet  Every 8 hours PRN        04/16/22 1437              Hendricks Limes, PA-C 04/16/22 [place], Bryant, DO 04/16/22 1511

## 2022-04-16 NOTE — Discharge Instructions (Addendum)
Please take pain medications as prescribed.  I was able today you can take your first dose of pain medication between 5 and 6 PM.  Zofran as needed for nausea and vomiting.  Please follow-up with Dr. Diona Fanti tomorrow in the office.  Return to the emergency department for any worsening symptoms you might have.

## 2022-04-16 NOTE — ED Triage Notes (Signed)
Patient c/o left flank pain and vomiting since 0200. Patient reports a history of kidney stones. Patient states he was having urinary frequency, but has not urinated since 0600 despite drinking water. Patient denies hematuria.

## 2022-04-16 NOTE — ED Provider Triage Note (Signed)
Emergency Medicine Provider Triage Evaluation Note  Sean [name] , a 51 y.o. male  was evaluated in triage.  Pt complains of left flank pain that started around 2 AM this morning.  He reports associated nausea and intractable vomiting.  Denies any urinary complaints.  Pain is nonradiating and characterizes a sharp sensation.  Patient does have a history of kidney stones and states this feels similar to previous episodes.  Review of Systems  Positive:  Negative: See above  Physical Exam  BP (!) 142/81 (BP Location: Right Arm)   Pulse (!) 51   Temp 98.6 F (37 C) (Oral)   Resp 18   Ht '5\' 9"'$  (1.753 m)   Wt 117.9 kg   SpO2 96%   BMI 38.40 kg/m  Gen:   Awake, no distress   Resp:  Normal effort  MSK:   Moves extremities without difficulty  Other:    Medical Decision Making  Medically screening exam initiated at 11:31 AM.  Appropriate orders placed.  Sean [name] was informed that the remainder of the evaluation will be completed by another provider, this initial triage assessment does not replace that evaluation, and the importance of remaining in the ED until their evaluation is complete.     Sean [name], [place] 04/16/22 1132

## 2022-04-17 NOTE — Progress Notes (Signed)
History of Present Illness: Sean [name] is here today for follow-up of recent left ureteral stone.  He presented to the emergency room on 6.3.2023 with left flank pain.  CT stone protocol revealed a 4 mm left mid ureteral stone.  He was sent home with pain medicine and alpha-blocker.  He comes in today with continued pain although this seems to be the oxycodone.  Also because of the headache.   Past Medical History:  Diagnosis Date   Cancer [facility]) 2003   testicular   History of kidney stones    Sleep apnea    pt states it was very mild and does not have to wear CPAP    Past Surgical History:  Procedure Laterality Date   ANTERIOR CERVICAL DECOMP/DISCECTOMY FUSION N/A 01/20/2021   Procedure: ANTERIOR CERVICAL DECOMPRESSION/DISCECTOMY FUSION 2 LEVELS CERVICAL FOUR-SIX;  Surgeon: [name], MD;  Location: [place];  Service: Orthopedics;  Laterality: N/A;  3 hrs   CYSTOSCOPY W/ URETERAL STENT PLACEMENT Right 03/25/2015   Procedure: CYSTOSCOPY, RETROGRADE PYELOGRAM  WITH RIGHT URETERAL STENT PLACEMENT;  Surgeon: [name], MD;  Location: [facility];  Service: Urology;  Laterality: Right;   CYSTOSCOPY WITH RETROGRADE PYELOGRAM, URETEROSCOPY AND STENT PLACEMENT Left 07/04/2015   Procedure: CYSTOSCOPY WITH RETROGRADE PYELOGRAM, URETEROSCOPY AND STENT PLACEMENT;  Surgeon: [name], MD;  Location: [facility];  Service: Urology;  Laterality: Left;  With Holmium Laser   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 If you have any questions, ask your nurse or doctor.          diazepam 10 MG tablet Commonly known as:  VALIUM Take 10 mg by mouth 3 (three) times daily.   EPINEPHrine 0.3 mg/0.3 mL Soaj injection Commonly known as: EPI-PEN Inject 0.3 mg into the muscle as needed for anaphylaxis.   escitalopram 20 MG tablet Commonly known as: LEXAPRO Take 20 mg by mouth daily.   famotidine 20 MG tablet Commonly known as: PEPCID Take 20 mg by mouth daily as needed for heartburn or indigestion.   furosemide 20 MG tablet Commonly known as: LASIX Take 1 tablet (20 mg total) by mouth daily as needed for edema.   ondansetron 4 MG disintegrating tablet Commonly known as: ZOFRAN-ODT Take 1 tablet (4 mg total) by mouth every 8 (eight) hours as needed for nausea or vomiting.   oxyCODONE-acetaminophen 5-325 MG tablet Commonly known as: PERCOCET/ROXICET Take 1-2 tablets by mouth every 6 (six) hours as needed for severe pain.        Allergies:  Allergies  Allergen Reactions   Bee Venom Anaphylaxis   Shellfish Allergy Anaphylaxis   Alpha-Gal     Pt has alpha gal but doesn't react to red meat or dairy    Gabapentin     Weird dreams   Hydromorphone Hcl Nausea And Vomiting and Rash    DILAUDID    No family history on file.  Social History:  reports that he has quit smoking. His smokeless  tobacco use includes snuff. He reports that he does not drink alcohol and does not use drugs.  ROS: A complete review of systems was performed.  All systems are negative except for pertinent findings as noted.  Physical Exam:  Vital signs in last 24 hours: There were no vitals taken for this visit. Constitutional:  Alert and oriented, No acute distress Cardiovascular: Regular rate  Respiratory: Normal respiratory effort Neurologic: Grossly intact, no focal deficits Psychiatric: Normal mood and affect  I have reviewed prior pt notes  I have reviewed notes from referring/previous physicians  I have reviewed urinalysis results  I have independently reviewed prior imaging--CT images reviewed. SSD 14 cm.   Hounsfield units approximately 600.  Stone visible on scout film.    Impression/Assessment:  Left mid ureteral stone, 6 x 4 mm.  Still symptomatic.  He would like treatment.  I discussed lithotripsy.  Plan:  1.  He does not need refill of oxycodone at the present time.  Still on tamsulosin  2.  I discussed lithotripsy with him.  We will see about getting this scheduled as soon as we can-most likely to be done next Tuesday  3.  KUB is done today.

## 2022-04-18 ENCOUNTER — Ambulatory Visit (HOSPITAL_COMMUNITY)
Admission: RE | Admit: 2022-04-18 | Discharge: 2022-04-18 | Disposition: A | Discharge status: Home / Self Care | Payer: BLUE CROSS/BLUE SHIELD | Source: Ambulatory Visit | Attending: Urology | Admitting: Urology

## 2022-04-18 ENCOUNTER — Ambulatory Visit: Payer: BLUE CROSS/BLUE SHIELD | Admitting: Urology

## 2022-04-18 ENCOUNTER — Other Ambulatory Visit: Payer: Self-pay | Admitting: Urology

## 2022-04-18 ENCOUNTER — Encounter: Payer: Self-pay | Admitting: Urology

## 2022-04-18 VITALS — BP 114/74 | HR 86

## 2022-04-18 DIAGNOSIS — N201 Calculus of ureter: Secondary | ICD-10-CM | POA: Diagnosis not present

## 2022-04-18 LAB — URINALYSIS, ROUTINE W REFLEX MICROSCOPIC
Bilirubin, UA: NEGATIVE
Glucose, UA: NEGATIVE
Ketones, UA: NEGATIVE
Nitrite, UA: NEGATIVE
Specific Gravity, UA: 1.025 (ref 1.005–1.030)
Urobilinogen, Ur: 0.2 mg/dL (ref 0.2–1.0)
pH, UA: 5.5 (ref 5.0–7.5)

## 2022-04-18 LAB — MICROSCOPIC EXAMINATION
RBC, Urine: >30 /hpf — AB (ref 0–2)
Renal Epithel, UA: NONE SEEN /hpf

## 2022-04-18 MED ORDER — ONDANSETRON 4 MG PO TBDP: 4.0000 mg | ORAL_TABLET | Freq: Three times a day (TID) | ORAL | 1 refills | Status: DC | PRN

## 2022-04-18 MED ORDER — OXYCODONE-ACETAMINOPHEN 5-325 MG PO TABS: 1.0000 | ORAL_TABLET | Freq: Four times a day (QID) | ORAL | 0 refills | Status: DC | PRN

## 2022-04-19 ENCOUNTER — Other Ambulatory Visit: Payer: Self-pay | Admitting: Urology

## 2022-04-19 ENCOUNTER — Encounter (HOSPITAL_BASED_OUTPATIENT_CLINIC_OR_DEPARTMENT_OTHER): Payer: Self-pay | Admitting: Urology

## 2022-04-19 NOTE — Progress Notes (Signed)
Patient pre-op phone complete. Procedure date and arrival time verified. Allergies, medications, and past medical history reviewed. Patient stated that he was diagnosed with a mild case of sleep apnea, but was not given a CPAP. Patient has Valium on medication record, but stated that he was out of them and would not have anymore before tomorrow. Patient advised not to take Lasix on 04/20/2022. Patient to have clear liquids until 0400 and to be NPO at midnight. Patient denies any home oxygen use. Patient also denies any recent history of chest pain or COVID. Patient to eat a light dinner tonight. Driver secured.

## 2022-04-20 ENCOUNTER — Encounter (HOSPITAL_BASED_OUTPATIENT_CLINIC_OR_DEPARTMENT_OTHER)
Admission: RE | Disposition: A | Discharge status: Home / Self Care | Payer: Self-pay | Source: Home / Self Care | Attending: Urology

## 2022-04-20 ENCOUNTER — Ambulatory Visit (HOSPITAL_COMMUNITY): Payer: BLUE CROSS/BLUE SHIELD

## 2022-04-20 ENCOUNTER — Other Ambulatory Visit: Payer: Self-pay

## 2022-04-20 ENCOUNTER — Ambulatory Visit (HOSPITAL_BASED_OUTPATIENT_CLINIC_OR_DEPARTMENT_OTHER)
Admission: RE | Admit: 2022-04-20 | Discharge: 2022-04-20 | Disposition: A | Discharge status: Home / Self Care | Payer: BLUE CROSS/BLUE SHIELD | Attending: Urology | Admitting: Urology

## 2022-04-20 ENCOUNTER — Encounter (HOSPITAL_BASED_OUTPATIENT_CLINIC_OR_DEPARTMENT_OTHER): Payer: Self-pay | Admitting: Urology

## 2022-04-20 DIAGNOSIS — N201 Calculus of ureter: Secondary | ICD-10-CM | POA: Diagnosis present

## 2022-04-20 HISTORY — PX: EXTRACORPOREAL SHOCK WAVE LITHOTRIPSY: SHX1557

## 2022-04-20 SURGERY — LITHOTRIPSY, ESWL
Anesthesia: LOCAL | Laterality: Left

## 2022-04-20 MED ORDER — CIPROFLOXACIN HCL 500 MG PO TABS
500.0000 mg | ORAL_TABLET | ORAL | Status: AC
  Administered 2022-04-20: 500 mg via ORAL

## 2022-04-20 MED ORDER — DIPHENHYDRAMINE HCL 25 MG PO CAPS
ORAL_CAPSULE | ORAL | Status: AC
  Filled 2022-04-20: qty 1

## 2022-04-20 MED ORDER — CIPROFLOXACIN HCL 500 MG PO TABS
ORAL_TABLET | ORAL | Status: AC
  Filled 2022-04-20: qty 1

## 2022-04-20 MED ORDER — POTASSIUM CHLORIDE IN NACL 20-0.9 MEQ/L-% IV SOLN: INTRAVENOUS | Status: DC

## 2022-04-20 MED ORDER — DIAZEPAM 5 MG PO TABS
ORAL_TABLET | ORAL | Status: AC
  Filled 2022-04-20: qty 2

## 2022-04-20 MED ORDER — OXYCODONE HCL 5 MG PO TABS
ORAL_TABLET | ORAL | Status: AC
  Filled 2022-04-20: qty 1

## 2022-04-20 MED ORDER — OXYCODONE HCL 5 MG PO TABS
5.0000 mg | ORAL_TABLET | Freq: Once | ORAL | Status: AC
  Administered 2022-04-20: 5 mg via ORAL

## 2022-04-20 MED ORDER — SODIUM CHLORIDE 0.9 % IV SOLN: INTRAVENOUS | Status: DC

## 2022-04-20 MED ORDER — TAMSULOSIN HCL 0.4 MG PO CAPS
0.4000 mg | ORAL_CAPSULE | Freq: Every day | ORAL | 0 refills | Status: DC
Start: 2022-04-20 — End: 2023-10-30

## 2022-04-20 MED ORDER — DIPHENHYDRAMINE HCL 25 MG PO CAPS
25.0000 mg | ORAL_CAPSULE | ORAL | Status: AC
  Administered 2022-04-20: 25 mg via ORAL

## 2022-04-20 MED ORDER — DIAZEPAM 5 MG PO TABS
10.0000 mg | ORAL_TABLET | ORAL | Status: AC
  Administered 2022-04-20: 10 mg via ORAL

## 2022-04-20 NOTE — Discharge Instructions (Signed)
See [facility] discharge instructions in chart.  

## 2022-04-20 NOTE — H&P (Signed)
Please see HP scanned into Epic

## 2022-04-21 ENCOUNTER — Ambulatory Visit (HOSPITAL_COMMUNITY): Payer: BLUE CROSS/BLUE SHIELD

## 2022-04-24 ENCOUNTER — Encounter (HOSPITAL_BASED_OUTPATIENT_CLINIC_OR_DEPARTMENT_OTHER): Payer: Self-pay | Admitting: Urology

## 2022-05-05 ENCOUNTER — Ambulatory Visit (HOSPITAL_COMMUNITY): Payer: BLUE CROSS/BLUE SHIELD | Attending: Cardiology

## 2022-05-05 DIAGNOSIS — R600 Localized edema: Secondary | ICD-10-CM | POA: Insufficient documentation

## 2022-05-05 LAB — ECHOCARDIOGRAM COMPLETE
Area-P 1/2: 2.99 cm2
S' Lateral: 2.3 cm

## 2022-05-08 ENCOUNTER — Encounter: Payer: Self-pay | Admitting: *Deleted

## 2022-05-08 ENCOUNTER — Telehealth: Payer: Self-pay | Admitting: Cardiovascular Disease

## 2022-06-17 ENCOUNTER — Other Ambulatory Visit: Payer: Self-pay | Admitting: Cardiovascular Disease

## 2022-07-19 ENCOUNTER — Ambulatory Visit: Payer: BLUE CROSS/BLUE SHIELD | Admitting: Cardiovascular Disease

## 2022-08-14 IMAGING — DX DG ABDOMEN 1V
1 series · 1 of 1 positions shown · non-contrast
Comparison: [DATE] [DATE], [DATE], [DATE] [DATE], [DATE]

CLINICAL DATA: Patient is having lithotripsy today.

EXAM:
ABDOMEN - 1 VIEW

[abdomen kub]
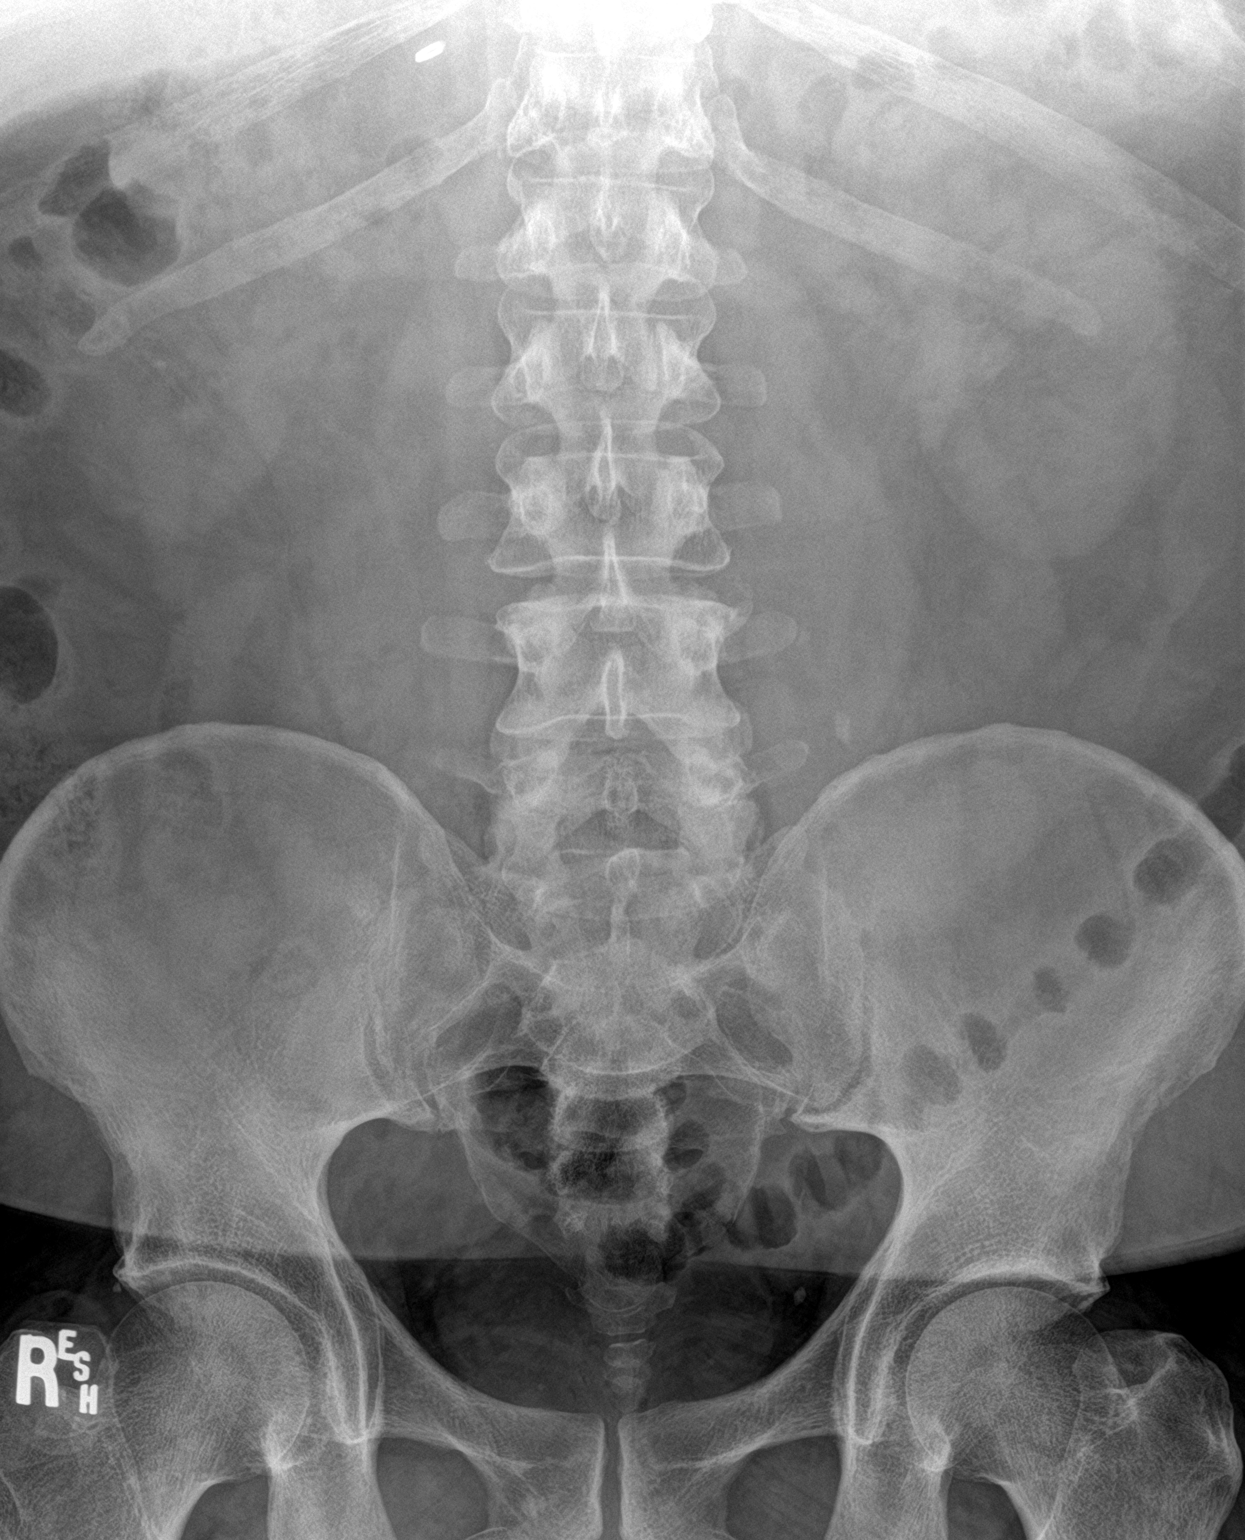

[1 of 1 positions shown; findings below may reference images not displayed]

FINDINGS: The bowel gas pattern is normal. 8 mm calcific density is identified
just above the left sacroiliac joint suggesting stone in the left
mid to distal ureter. Left pelvic phleboliths is unchanged compared
prior exam of 0196.
IMPRESSION: 8 mm calcific density is identified just above the left sacroiliac
joint suggesting stone in the left mid to distal ureter.

## 2022-08-23 ENCOUNTER — Ambulatory Visit: Payer: Self-pay | Admitting: Internal Medicine

## 2022-09-18 ENCOUNTER — Telehealth: Payer: Self-pay

## 2022-09-18 MED ORDER — FUROSEMIDE 20 MG PO TABS
20.0000 mg | ORAL_TABLET | Freq: Every day | ORAL | 0 refills | Status: DC | PRN
Start: 2022-09-18 — End: 2023-02-27

## 2022-09-18 NOTE — Telephone Encounter (Signed)
Pt wife called in for Lasix refill. Pt is overdue for f/u refill sent, made f/u appt for January.

## 2022-10-10 ENCOUNTER — Ambulatory Visit: Payer: Self-pay | Admitting: Allergy and Immunology

## 2022-12-08 ENCOUNTER — Ambulatory Visit: Payer: BLUE CROSS/BLUE SHIELD | Admitting: Cardiovascular Disease

## 2022-12-26 ENCOUNTER — Ambulatory Visit: Payer: Self-pay

## 2023-02-20 ENCOUNTER — Other Ambulatory Visit (HOSPITAL_COMMUNITY): Payer: Self-pay | Admitting: Internal Medicine

## 2023-02-20 DIAGNOSIS — Z136 Encounter for screening for cardiovascular disorders: Secondary | ICD-10-CM

## 2023-02-25 ENCOUNTER — Other Ambulatory Visit: Payer: Self-pay | Admitting: Cardiovascular Disease

## 2023-03-02 ENCOUNTER — Encounter (HOSPITAL_COMMUNITY): Payer: Self-pay

## 2023-03-02 ENCOUNTER — Ambulatory Visit (HOSPITAL_COMMUNITY): Payer: BLUE CROSS/BLUE SHIELD

## 2023-03-12 ENCOUNTER — Ambulatory Visit (HOSPITAL_COMMUNITY): Payer: BLUE CROSS/BLUE SHIELD

## 2023-03-12 ENCOUNTER — Encounter (HOSPITAL_COMMUNITY): Payer: Self-pay

## 2023-08-05 ENCOUNTER — Encounter (HOSPITAL_COMMUNITY): Payer: Self-pay

## 2023-08-05 ENCOUNTER — Other Ambulatory Visit: Payer: Self-pay

## 2023-08-05 ENCOUNTER — Emergency Department (HOSPITAL_COMMUNITY)
Admission: EM | Admit: 2023-08-05 | Discharge: 2023-08-06 | Disposition: A | Discharge status: Home / Self Care | Payer: BLUE CROSS/BLUE SHIELD | Attending: Emergency Medicine | Admitting: Emergency Medicine

## 2023-08-05 DIAGNOSIS — R031 Nonspecific low blood-pressure reading: Secondary | ICD-10-CM | POA: Diagnosis present

## 2023-08-05 DIAGNOSIS — T428X5A Adverse effect of antiparkinsonism drugs and other central muscle-tone depressants, initial encounter: Secondary | ICD-10-CM | POA: Diagnosis not present

## 2023-08-05 DIAGNOSIS — T50905A Adverse effect of unspecified drugs, medicaments and biological substances, initial encounter: Secondary | ICD-10-CM

## 2023-08-05 DIAGNOSIS — Z8547 Personal history of malignant neoplasm of testis: Secondary | ICD-10-CM | POA: Insufficient documentation

## 2023-08-05 LAB — CBC WITH DIFFERENTIAL/PLATELET
Abs Immature Granulocytes: 0.03 10*3/uL (ref 0.00–0.07)
Basophils Absolute: 0.1 10*3/uL (ref 0.0–0.1)
Basophils Relative: 1 %
Eosinophils Absolute: 0.2 10*3/uL (ref 0.0–0.5)
Eosinophils Relative: 3 %
HCT: 44 % (ref 39.0–52.0)
Hemoglobin: 14.4 g/dL (ref 13.0–17.0)
Immature Granulocytes: 1 %
Lymphocytes Relative: 41 %
Lymphs Abs: 2.6 10*3/uL (ref 0.7–4.0)
MCH: 29.6 pg (ref 26.0–34.0)
MCHC: 32.7 g/dL (ref 30.0–36.0)
MCV: 90.5 fL (ref 80.0–100.0)
Monocytes Absolute: 0.8 10*3/uL (ref 0.1–1.0)
Monocytes Relative: 12 %
Neutro Abs: 2.6 10*3/uL (ref 1.7–7.7)
Neutrophils Relative %: 42 %
Platelets: 211 10*3/uL (ref 150–400)
RBC: 4.86 MIL/uL (ref 4.22–5.81)
RDW: 13.5 % (ref 11.5–15.5)
WBC: 6.3 10*3/uL (ref 4.0–10.5)
nRBC: 0 % (ref 0.0–0.2)

## 2023-08-05 LAB — BASIC METABOLIC PANEL
Anion gap: 8 (ref 5–15)
BUN: 9 mg/dL (ref 6–20)
CO2: 24 mmol/L (ref 22–32)
Calcium: 8.5 mg/dL — ABNORMAL LOW (ref 8.9–10.3)
Chloride: 104 mmol/L (ref 98–111)
Creatinine, Ser: 0.97 mg/dL (ref 0.61–1.24)
GFR, Estimated: >60 mL/min (ref >60)
Glucose, Bld: 75 mg/dL (ref 70–99)
Potassium: 3.5 mmol/L (ref 3.5–5.1)
Sodium: 136 mmol/L (ref 135–145)

## 2023-08-05 LAB — TROPONIN I (HIGH SENSITIVITY): Troponin I (High Sensitivity): 2 ng/L (ref <18)

## 2023-08-05 MED ORDER — SODIUM CHLORIDE 0.9 % IV BOLUS
1000.0000 mL | Freq: Once | INTRAVENOUS | Status: AC
  Administered 2023-08-05: 1000 mL via INTRAVENOUS

## 2023-08-05 NOTE — ED Triage Notes (Addendum)
C/o hypotension with bp 90/50 after taking 12mg  of tizanidine.  Wife had checked bp after taking the medicine.  Denies dizziness, blurred vision, lightheaded.

## 2023-08-05 NOTE — ED Provider Notes (Signed)
[name] EMERGENCY DEPARTMENT AT [facility] Provider Note   CSN: [id number] Arrival date & time: 08/05/23  2217     History {Add pertinent medical, surgical, social history, OB history to HPI:1} Chief Complaint  Patient presents with   Hypotension    Sean [name] is a 52 y.o. male.  The history is provided by the patient.  He has history of kidney stones, testicular cancer, cervical radiculopathy and comes in because of low blood pressure at home.  He took a dose of tizanidine 12 mg and following that, was very sleepy and complains of a dry mouth.  His wife checked his blood pressure and noted it was low.  Lowest blood pressure she recorded was 80/48, but did not get anything higher than 95 systolic.  He denies dizziness or lightheadedness and denies chest pain or heaviness or tightness or pressure.  He denies dyspnea, nausea, diaphoresis.  He is feeling a little weak but denies feeling dizzy or lightheaded.  He states that he has taken a similar dose of tizanidine in the past without any problems.  He denies history of diabetes, hypertension, hyperlipidemia.  He is a non-smoker.  There is no family history of premature coronary atherosclerosis.   Home Medications Prior to Admission medications   Medication Sig Start Date End Date Taking? Authorizing Provider  diazepam (VALIUM) 10 MG tablet Take 10 mg by mouth 3 (three) times daily. 02/14/22   [provider]  EPINEPHrine 0.3 mg/0.3 mL IJ SOAJ injection Inject 0.3 mg into the muscle as needed for anaphylaxis.    [provider]  escitalopram (LEXAPRO) 20 MG tablet Take 20 mg by mouth daily. 03/28/22   [provider]  famotidine (PEPCID) 20 MG tablet Take 20 mg by mouth daily as needed for heartburn or indigestion.    [provider]  furosemide (LASIX) 20 MG tablet TAKE 1 TABLET (20 MG TOTAL) BY MOUTH DAILY AS NEEDED FOR EDEMA. 02/27/23 11/24/23  Croitoru, Mihai, MD  ondansetron  (ZOFRAN-ODT) 4 MG disintegrating tablet Take 1 tablet (4 mg total) by mouth every 8 (eight) hours as needed for nausea or vomiting. 04/18/22   Marcine Matar, MD  oxyCODONE-acetaminophen (PERCOCET/ROXICET) 5-325 MG tablet Take 1-2 tablets by mouth every 6 (six) hours as needed for severe pain. 04/18/22   Marcine Matar, MD  tamsulosin (FLOMAX) 0.4 MG CAPS capsule Take 1 capsule (0.4 mg total) by mouth at bedtime. 04/20/22   Noel Christmas, MD      Allergies    Bee venom, Shellfish allergy, Alpha-gal, Gabapentin, and Hydromorphone hcl    Review of Systems   Review of Systems  All other systems reviewed and are negative.   Physical Exam Updated Vital Signs BP 104/70   Pulse 84   Temp 98.5 F (36.9 C)   Resp 20   Wt 117.9 kg   SpO2 97%   BMI 38.40 kg/m  Physical Exam Vitals and nursing note reviewed.   52 year old male, resting comfortably and in no acute distress. Vital signs are normal. Oxygen saturation is 97%, which is normal. Head is normocephalic and atraumatic. PERRLA, EOMI. Oropharynx is clear. Neck is nontender and supple without adenopathy. Lungs are clear without rales, wheezes, or rhonchi. Chest is nontender. Heart has regular rate and rhythm without murmur. Abdomen is soft, flat, nontender. Extremities have no cyanosis or edema. Skin is warm and dry without rash. Neurologic: Mental status is normal, cranial nerves are intact, moves all extremities equally.  ED  Results / Procedures / Treatments   Labs (all labs ordered are listed, but only abnormal results are displayed) Labs Reviewed - No data to display  EKG None  Radiology No results found.  Procedures Procedures  Cardiac monitor shows normal sinus rhythm, per my interpretation.  Medications Ordered in ED Medications - No data to display  ED Course/ Medical Decision Making/ A&P   {   Click here for ABCD2, HEART and other calculatorsREFRESH Note before signing :1}                               Medical Decision Making  Low blood pressure following dose of tizanidine.  Dry mouth and low blood pressure are likely side effect of tizanidine.  Blood pressure here is normal, but lower than what his usual blood pressure readings are.  On review of past records, his usual blood pressure is in the 120s-130s systolic.  Other diagnostic possibilities are called myocardial infarction, dehydration.  I have ordered IV fluids, ECG, CBC, basic metabolic panel, troponin x 2.  {Document critical care time when appropriate:1} {Document review of labs and clinical decision tools ie heart score, Chads2Vasc2 etc:1}  {Document your independent review of radiology images, and any outside records:1} {Document your discussion with family members, caretakers, and with consultants:1} {Document social determinants of health affecting pt's care:1} {Document your decision making why or why not admission, treatments were needed:1} Final Clinical Impression(s) / ED Diagnoses Final diagnoses:  None    Rx / DC Orders ED Discharge Orders     None

## 2023-08-06 LAB — TROPONIN I (HIGH SENSITIVITY): Troponin I (High Sensitivity): <2 ng/L (ref <18)

## 2023-08-06 NOTE — Discharge Instructions (Signed)
The next time that you feel you need to take tizanidine, try taking only 2 tablets (8 mg) at a time.  If you are not getting the desired effect, you can always take the third tablet 30-60 minutes later.  Return if you are having any new or concerning symptoms.

## 2023-09-06 ENCOUNTER — Ambulatory Visit: Payer: Self-pay | Admitting: Behavioral Health

## 2023-09-27 LAB — COLOGUARD: COLOGUARD: NEGATIVE

## 2023-09-27 LAB — EXTERNAL GENERIC LAB PROCEDURE: COLOGUARD: NEGATIVE

## 2023-10-30 ENCOUNTER — Emergency Department (HOSPITAL_COMMUNITY)
Admission: EM | Admit: 2023-10-30 | Discharge: 2023-10-30 | Disposition: A | Discharge status: Home / Self Care | Payer: BLUE CROSS/BLUE SHIELD | Attending: Emergency Medicine | Admitting: Emergency Medicine

## 2023-10-30 ENCOUNTER — Telehealth: Payer: Self-pay | Admitting: Urology

## 2023-10-30 ENCOUNTER — Encounter (HOSPITAL_COMMUNITY): Payer: Self-pay | Admitting: Emergency Medicine

## 2023-10-30 ENCOUNTER — Emergency Department (HOSPITAL_COMMUNITY): Payer: BLUE CROSS/BLUE SHIELD

## 2023-10-30 ENCOUNTER — Other Ambulatory Visit: Payer: Self-pay

## 2023-10-30 DIAGNOSIS — Z8547 Personal history of malignant neoplasm of testis: Secondary | ICD-10-CM | POA: Diagnosis not present

## 2023-10-30 DIAGNOSIS — R1031 Right lower quadrant pain: Secondary | ICD-10-CM | POA: Diagnosis present

## 2023-10-30 DIAGNOSIS — N201 Calculus of ureter: Secondary | ICD-10-CM | POA: Insufficient documentation

## 2023-10-30 LAB — URINALYSIS, ROUTINE W REFLEX MICROSCOPIC
Bacteria, UA: NONE SEEN
Bilirubin Urine: NEGATIVE
Glucose, UA: NEGATIVE mg/dL
Ketones, ur: NEGATIVE mg/dL
Leukocytes,Ua: NEGATIVE
Nitrite: NEGATIVE
Protein, ur: 100 mg/dL — AB
RBC / HPF: >50 RBC/hpf (ref 0–5)
Specific Gravity, Urine: >1.046 — ABNORMAL HIGH (ref 1.005–1.030)
pH: 5 (ref 5.0–8.0)

## 2023-10-30 LAB — COMPREHENSIVE METABOLIC PANEL
ALT: 58 U/L — ABNORMAL HIGH (ref 0–44)
AST: 34 U/L (ref 15–41)
Albumin: 3.7 g/dL (ref 3.5–5.0)
Alkaline Phosphatase: 61 U/L (ref 38–126)
Anion gap: 9 (ref 5–15)
BUN: 12 mg/dL (ref 6–20)
CO2: 25 mmol/L (ref 22–32)
Calcium: 8.9 mg/dL (ref 8.9–10.3)
Chloride: 104 mmol/L (ref 98–111)
Creatinine, Ser: 1 mg/dL (ref 0.61–1.24)
GFR, Estimated: >60 mL/min (ref >60)
Glucose, Bld: 105 mg/dL — ABNORMAL HIGH (ref 70–99)
Potassium: 3.5 mmol/L (ref 3.5–5.1)
Sodium: 138 mmol/L (ref 135–145)
Total Bilirubin: 0.7 mg/dL (ref <1.2)
Total Protein: 6.9 g/dL (ref 6.5–8.1)

## 2023-10-30 LAB — CBC
HCT: 48.7 % (ref 39.0–52.0)
Hemoglobin: 16.1 g/dL (ref 13.0–17.0)
MCH: 29.3 pg (ref 26.0–34.0)
MCHC: 33.1 g/dL (ref 30.0–36.0)
MCV: 88.7 fL (ref 80.0–100.0)
Platelets: 251 10*3/uL (ref 150–400)
RBC: 5.49 MIL/uL (ref 4.22–5.81)
RDW: 12.8 % (ref 11.5–15.5)
WBC: 7.7 10*3/uL (ref 4.0–10.5)
nRBC: 0 % (ref 0.0–0.2)

## 2023-10-30 LAB — LIPASE, BLOOD: Lipase: 34 U/L (ref 11–51)

## 2023-10-30 MED ORDER — OXYCODONE-ACETAMINOPHEN 5-325 MG PO TABS: 1.0000 | ORAL_TABLET | Freq: Four times a day (QID) | ORAL | 0 refills | Status: DC | PRN

## 2023-10-30 MED ORDER — ONDANSETRON 4 MG PO TBDP: ORAL_TABLET | ORAL | 0 refills | Status: AC

## 2023-10-30 MED ORDER — MORPHINE SULFATE (PF) 4 MG/ML IV SOLN
6.0000 mg | Freq: Once | INTRAVENOUS | Status: AC
  Administered 2023-10-30: 6 mg via INTRAVENOUS
  Filled 2023-10-30: qty 2

## 2023-10-30 MED ORDER — MORPHINE SULFATE (PF) 4 MG/ML IV SOLN
4.0000 mg | Freq: Once | INTRAVENOUS | Status: AC
  Administered 2023-10-30: 4 mg via INTRAVENOUS
  Filled 2023-10-30: qty 1

## 2023-10-30 MED ORDER — NAPROXEN 500 MG PO TABS: 500.0000 mg | ORAL_TABLET | Freq: Two times a day (BID) | ORAL | 0 refills | Status: DC

## 2023-10-30 MED ORDER — KETOROLAC TROMETHAMINE 30 MG/ML IJ SOLN
30.0000 mg | Freq: Once | INTRAMUSCULAR | Status: AC
  Administered 2023-10-30: 30 mg via INTRAVENOUS
  Filled 2023-10-30: qty 1

## 2023-10-30 MED ORDER — HYDROCODONE-ACETAMINOPHEN 5-325 MG PO TABS: 1.0000 | ORAL_TABLET | Freq: Four times a day (QID) | ORAL | 0 refills | Status: DC | PRN

## 2023-10-30 MED ORDER — CEPHALEXIN 500 MG PO CAPS: 500.0000 mg | ORAL_CAPSULE | Freq: Four times a day (QID) | ORAL | 0 refills | Status: DC

## 2023-10-30 MED ORDER — TAMSULOSIN HCL 0.4 MG PO CAPS: 0.4000 mg | ORAL_CAPSULE | Freq: Every day | ORAL | 0 refills | Status: AC

## 2023-10-30 MED ORDER — MORPHINE SULFATE (PF) 4 MG/ML IV SOLN
4.0000 mg | Freq: Once | INTRAVENOUS | Status: AC
Start: 2023-10-30 — End: 2023-10-30
  Administered 2023-10-30: 4 mg via INTRAVENOUS
  Filled 2023-10-30: qty 1

## 2023-10-30 MED ORDER — ONDANSETRON HCL 4 MG/2ML IJ SOLN
4.0000 mg | Freq: Once | INTRAMUSCULAR | Status: AC
  Administered 2023-10-30: 4 mg via INTRAVENOUS
  Filled 2023-10-30: qty 2

## 2023-10-30 MED ORDER — IOHEXOL 300 MG/ML  SOLN
100.0000 mL | Freq: Once | INTRAMUSCULAR | Status: AC | PRN
  Administered 2023-10-30: 100 mL via INTRAVENOUS

## 2023-10-30 NOTE — Discharge Instructions (Addendum)
Follow-up with urology either the end of this week or beginning of next week

## 2023-10-30 NOTE — Telephone Encounter (Signed)
Next available is 1/7 is it okay to double book?

## 2023-10-30 NOTE — Telephone Encounter (Signed)
Called Pt to make him aware of his appt on 01/07 @1pm 

## 2023-10-30 NOTE — ED Notes (Signed)
Patient transported to CT 

## 2023-10-30 NOTE — Telephone Encounter (Signed)
Please advise and I'll give her a call

## 2023-10-30 NOTE — ED Provider Notes (Signed)
Patient with a 4 mm right ureteral stone.  He is sent home with Percocets, Flomax, Zofran, oxycodone and Keflex.  He will follow-up with urology 7   Bethann Berkshire, MD 10/30/23 [phone]

## 2023-10-30 NOTE — ED Triage Notes (Signed)
Pt with c/o RLQ abdominal pain that radiates to his "right side", nausea, and vomiting since Friday.

## 2023-10-30 NOTE — ED Provider Notes (Signed)
[name]  Provider Note  CSN: [id number] Arrival date & time: 10/30/23 4098  History Chief Complaint  Patient presents with   Abdominal Pain    Sean [name] is a 52 y.o. male with history of testicular cancer and renal stones presents with wife at bedside for evaluation of 3 days of intermittent RLQ abdominal pain, some discomfort into R flank and into R groin. Pain has been constant since around midnight. He has had some nausea and vomiting. No fever. Feels somewhat similar to previous renal colic.    Home Medications Prior to Admission medications   Medication Sig Start Date End Date Taking? Authorizing Provider  HYDROcodone-acetaminophen (NORCO/VICODIN) 5-325 MG tablet Take 1 tablet by mouth every 6 (six) hours as needed for severe pain (pain score 7-10). 10/30/23  Yes Pollyann Savoy, MD  naproxen (NAPROSYN) 500 MG tablet Take 1 tablet (500 mg total) by mouth 2 (two) times daily. 10/30/23  Yes Pollyann Savoy, MD  tamsulosin (FLOMAX) 0.4 MG CAPS capsule Take 1 capsule (0.4 mg total) by mouth daily. 10/30/23  Yes Pollyann Savoy, MD  diazepam (VALIUM) 10 MG tablet Take 10 mg by mouth 3 (three) times daily. 02/14/22   [provider]  EPINEPHrine 0.3 mg/0.3 mL IJ SOAJ injection Inject 0.3 mg into the muscle as needed for anaphylaxis.    [provider]  escitalopram (LEXAPRO) 20 MG tablet Take 20 mg by mouth daily. 03/28/22   [provider]  famotidine (PEPCID) 20 MG tablet Take 20 mg by mouth daily as needed for heartburn or indigestion.    [provider]  furosemide (LASIX) 20 MG tablet TAKE 1 TABLET (20 MG TOTAL) BY MOUTH DAILY AS NEEDED FOR EDEMA. 02/27/23 11/24/23  Croitoru, Mihai, MD  ondansetron (ZOFRAN-ODT) 4 MG disintegrating tablet Take 1 tablet (4 mg total) by mouth every 8 (eight) hours as needed for nausea or vomiting. 04/18/22   Marcine Matar, MD     Allergies    Bee  venom, Shellfish allergy, Alpha-gal, Gabapentin, and Hydromorphone hcl   Review of Systems   Review of Systems Please see HPI for pertinent positives and negatives  Physical Exam BP (!) 166/98   Pulse 74   Temp 98.2 F (36.8 C) (Oral)   Resp 18   Ht 5\' 9"  (1.753 m)   Wt 117.9 kg   SpO2 98%   BMI 38.40 kg/m   Physical Exam Vitals and nursing note reviewed.  Constitutional:      Appearance: Normal appearance.  HENT:     Head: Normocephalic and atraumatic.     Nose: Nose normal.     Mouth/Throat:     Mouth: Mucous membranes are moist.  Eyes:     Extraocular Movements: Extraocular movements intact.     Conjunctiva/sclera: Conjunctivae normal.  Cardiovascular:     Rate and Rhythm: Normal rate.  Pulmonary:     Effort: Pulmonary effort is normal.     Breath sounds: Normal breath sounds.  Abdominal:     General: Abdomen is flat.     Palpations: Abdomen is soft.     Tenderness: There is abdominal tenderness in the right lower quadrant. There is no guarding. Negative signs include Murphy's sign and McBurney's sign.  Musculoskeletal:        General: No swelling. Normal range of motion.     Cervical back: Neck supple.  Skin:    General: Skin is warm and dry.  Neurological:  General: No focal deficit present.     Mental Status: He is alert.  Psychiatric:        Mood and Affect: Mood normal.     ED Results / Procedures / Treatments   EKG None  Procedures Procedures  Medications Ordered in the ED Medications  ondansetron (ZOFRAN) injection 4 mg (4 mg Intravenous Given 10/30/23 0534)  morphine (PF) 4 MG/ML injection 4 mg (4 mg Intravenous Given 10/30/23 0534)  iohexol (OMNIPAQUE) 300 MG/ML solution 100 mL (100 mLs Intravenous Contrast Given 10/30/23 0606)  morphine (PF) 4 MG/ML injection 4 mg (4 mg Intravenous Given 10/30/23 0628)  ketorolac (TORADOL) 30 MG/ML injection 30 mg (30 mg Intravenous Given 10/30/23 4401)    Initial Impression and Plan  Patient here  with RLQ and R flank pain, suspect renal colic vs appendicitis, there is some tenderness there but no peritoneal signs. Will check labs and send for CT. Pain/nausea meds for comfort.   ED Course   Clinical Course as of 10/30/23 0656  Tue Oct 30, 2023  0540 CBC is normal.  [CS]  0602 CMP and lipase are unremarkable.  [CS]  [id number] I personally viewed the images from radiology studies: CT appears to show a right mid-ureteral stone. Awaiting formal interpretation. Pain is well controlled. Care of the patient will be signed out to the oncoming team at shift change.  [CS]    Clinical Course User Index [CS] Pollyann Savoy, MD     MDM Rules/Calculators/A&P Medical Decision Making Problems Addressed: Right ureteral stone: acute illness or injury  Amount and/or Complexity of Data Reviewed Labs: ordered. Decision-making details documented in ED Course. Radiology: ordered and independent interpretation performed. Decision-making details documented in ED Course.  Risk Prescription drug management. Parenteral controlled substances.     Final Clinical Impression(s) / ED Diagnoses Final diagnoses:  Right ureteral stone    Rx / DC Orders ED Discharge Orders          Ordered    HYDROcodone-acetaminophen (NORCO/VICODIN) 5-325 MG tablet  Every 6 hours PRN        10/30/23 0648    tamsulosin (FLOMAX) 0.4 MG CAPS capsule  Daily        10/30/23 0648    naproxen (NAPROSYN) 500 MG tablet  2 times daily        10/30/23 0272             Pollyann Savoy, MD 10/30/23 [phone]

## 2023-10-30 NOTE — Telephone Encounter (Signed)
Wife states husband is in ER at [facility] and thinks [facility] will stop by or work him in. She states he has been a patient for 21 years. She would like a call back.

## 2023-10-30 NOTE — ED Notes (Signed)
Pt and family continue to call out requesting calls to radiology and to their urologist. Rn tried to explain how the process generally works. Spouse continued to ask about their urologist. Pt also requested a different medication for take home due to the hydrocodone being ineffective. Spouse stated they have been through this so many times and with it being almost Christmas they would like to know how big the stone it. RN expressed understanding. RN administered ordered IV pain medication. Rn contacted EDP who gave same information RN gave to patient.

## 2023-10-31 LAB — URINE CULTURE: Culture: NO GROWTH

## 2023-11-20 ENCOUNTER — Ambulatory Visit: Payer: BLUE CROSS/BLUE SHIELD | Admitting: Urology

## 2024-04-07 ENCOUNTER — Ambulatory Visit
Admission: EM | Admit: 2024-04-07 | Discharge: 2024-04-07 | Disposition: A | Discharge status: Home / Self Care | Attending: Family Medicine | Admitting: Family Medicine

## 2024-04-07 DIAGNOSIS — J0110 Acute frontal sinusitis, unspecified: Secondary | ICD-10-CM

## 2024-04-07 DIAGNOSIS — H66002 Acute suppurative otitis media without spontaneous rupture of ear drum, left ear: Secondary | ICD-10-CM

## 2024-04-07 DIAGNOSIS — R519 Headache, unspecified: Secondary | ICD-10-CM

## 2024-04-07 DIAGNOSIS — J209 Acute bronchitis, unspecified: Secondary | ICD-10-CM

## 2024-04-07 MED ORDER — AMOXICILLIN-POT CLAVULANATE 875-125 MG PO TABS
1.0000 | ORAL_TABLET | Freq: Two times a day (BID) | ORAL | 0 refills | Status: AC
Start: 2024-04-07 — End: 2024-04-17

## 2024-04-07 MED ORDER — KETOROLAC TROMETHAMINE 30 MG/ML IJ SOLN
30.0000 mg | Freq: Once | INTRAMUSCULAR | Status: AC
  Administered 2024-04-07: 30 mg via INTRAMUSCULAR

## 2024-04-07 MED ORDER — ALBUTEROL SULFATE HFA 108 (90 BASE) MCG/ACT IN AERS: 1.0000 | INHALATION_SPRAY | Freq: Four times a day (QID) | RESPIRATORY_TRACT | 0 refills | Status: AC | PRN

## 2024-04-07 MED ORDER — PSEUDOEPH-BROMPHEN-DM 30-2-10 MG/5ML PO SYRP: 5.0000 mL | ORAL_SOLUTION | Freq: Four times a day (QID) | ORAL | 0 refills | Status: AC | PRN

## 2024-04-07 NOTE — ED Provider Notes (Addendum)
 [facility]    CSN: [id number] Arrival date & time: 04/07/24  1407      History   Chief Complaint Chief Complaint  Patient presents with   Cough    HPI Sean [name] is a 53 y.o. male  presents for evaluation of URI symptoms for 1.5 weeks. Patient reports associated symptoms of cough, congestion, sinus pressure/pain with sinus headaches and ear pain, shortness of breath with coughing. Denies N/V/D, sore throat, fevers, body aches. Patient does not have a hx of asthma.  Wife has similar symptoms.  Pt has taken Valium  and oxycodone  for his headache which he states he takes for migraines, last dose was 5 AM today.  Also took some of his wife's doxycycline .  Pt has no other concerns at this time.    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/20/2021   Carpal tunnel syndrome 08/16/2018   Ureteral calculus, left 07/04/2015   Renal colic on right side 03/25/2015   Nephrolithiasis 03/25/2015   COCCYGEAL PAIN 09/23/2007   Sprain of ankle 09/23/2007    Past Surgical History:  Procedure Laterality Date   ANTERIOR CERVICAL DECOMP/DISCECTOMY FUSION N/A 01/20/2021   Procedure: ANTERIOR CERVICAL DECOMPRESSION/DISCECTOMY FUSION 2 LEVELS CERVICAL FOUR-SIX;  Surgeon: Mort Ards, MD;  Location: [facility];  Service: Orthopedics;  Laterality: N/A;  3 hrs   CYSTOSCOPY W/ URETERAL STENT PLACEMENT Right 03/25/2015   Procedure: CYSTOSCOPY, RETROGRADE PYELOGRAM  WITH RIGHT URETERAL STENT PLACEMENT;  Surgeon: Erman Hayward, MD;  Location: [facility];  Service: Urology;  Laterality: Right;   CYSTOSCOPY WITH RETROGRADE PYELOGRAM, URETEROSCOPY AND STENT PLACEMENT Left 07/04/2015   Procedure: CYSTOSCOPY WITH  RETROGRADE PYELOGRAM, URETEROSCOPY AND STENT PLACEMENT;  Surgeon: Trent Frizzle, MD;  Location: [facility];  Service: Urology;  Laterality: Left;  With Holmium Laser   EXTRACORPOREAL SHOCK WAVE LITHOTRIPSY Left 04/20/2022   Procedure: EXTRACORPOREAL SHOCK WAVE LITHOTRIPSY (ESWL);  Surgeon: Roxane Copp, MD;  Location: [facility];  Service: Urology;  Laterality: Left;   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 Authorizing Provider  albuterol (VENTOLIN HFA) 108 (90 Base) MCG/ACT inhaler Inhale 1-2 puffs into the lungs every 6 (six) hours as needed. 04/07/24  Yes Rio Taber, Jodi R, NP  amoxicillin-clavulanate (AUGMENTIN) 875-125 MG tablet Take 1 tablet by mouth every 12 (twelve) hours for 10 days. 04/07/24 04/17/24 Yes Ramah Langhans, Jodi R, NP  brompheniramine-pseudoephedrine -DM 30-2-10 MG/5ML syrup Take 5 mLs by mouth 4 (four) times daily as needed. 04/07/24  Yes Shean Gerding, Jodi R, NP  diazepam  (VALIUM ) 10 MG tablet Take 10 mg by mouth 3 (three) times daily. 02/14/22   [provider]  EPINEPHrine  0.3 mg/0.3 mL IJ SOAJ injection Inject 0.3 mg into the muscle as needed for anaphylaxis.    [provider]  escitalopram (LEXAPRO) 20 MG tablet Take 20 mg by mouth daily. 03/28/22   [provider]  famotidine  (PEPCID ) 20 MG tablet Take 20 mg by mouth daily as needed for heartburn or  indigestion.    [provider]  furosemide  (LASIX ) 20 MG tablet TAKE 1 TABLET (20 MG TOTAL) BY MOUTH DAILY AS NEEDED FOR EDEMA. 02/27/23 11/24/23  Croitoru, Mihai, MD  naproxen  (NAPROSYN ) 500 MG tablet Take 1 tablet (500 mg total) by mouth 2 (two) times daily. 10/30/23   Charmayne Cooper, MD  ondansetron  (ZOFRAN -ODT) 4 MG disintegrating tablet 4mg  ODT q4 hours prn nausea/vomit 10/30/23   Zammit, Joseph, MD  oxyCODONE -acetaminophen  (PERCOCET/ROXICET) 5-325 MG tablet Take  1 tablet by mouth every 6 (six) hours as needed for severe pain (pain score 7-10). 10/30/23   Cheyenne Cotta, MD  tamsulosin  (FLOMAX ) 0.4 MG CAPS capsule Take 1 capsule (0.4 mg total) by mouth daily. 10/30/23   Charmayne Cooper, MD    Family History History reviewed. No pertinent family history.  Social History Social History   Tobacco Use   Smoking status: Former   Smokeless tobacco: Current    Types: Snuff  Vaping Use   Vaping status: Never Used  Substance Use Topics   Alcohol use: No   Drug use: No     Allergies   Bee venom, Shellfish allergy, Alpha-gal, Gabapentin, and Hydromorphone  hcl   Review of Systems Review of Systems  HENT:  Positive for congestion, sinus pressure and sinus pain.   Respiratory:  Positive for cough and shortness of breath.   Neurological:  Positive for headaches.     Physical Exam Triage Vital Signs ED Triage Vitals  Encounter Vitals Group     BP 04/07/24 1450 125/89     Systolic BP Percentile --      Diastolic BP Percentile --      Pulse Rate 04/07/24 1450 99     Resp 04/07/24 1450 17     Temp 04/07/24 1450 98.1 F (36.7 C)     Temp Source 04/07/24 1450 Oral     SpO2 04/07/24 1450 95 %     Weight --      Height --      Head Circumference --      Peak Flow --      Pain Score 04/07/24 1449 10     Pain Loc --      Pain Education --      Exclude from Growth Chart --    No data found.  Updated Vital Signs BP 125/89 (BP Location: Right Arm)   Pulse 99   Temp 98.1 F (36.7 C) (Oral)   Resp 17   SpO2 95%   Visual Acuity Right Eye Distance:   Left Eye Distance:   Bilateral Distance:    Right Eye Near:   Left Eye Near:    Bilateral Near:     Physical Exam Vitals and nursing note reviewed.  Constitutional:      General: He is not in acute distress.    Appearance: Normal appearance. He is not ill-appearing or toxic-appearing.  HENT:     Head: Normocephalic and atraumatic.     Right Ear: Tympanic membrane and ear canal  normal.     Left Ear: Ear canal normal. Tympanic membrane is erythematous.     Nose: Congestion present.     Right Turbinates: Swollen and pale.     Left Turbinates: Swollen and pale.     Right Sinus: Maxillary sinus tenderness and frontal sinus tenderness present.     Left Sinus: Maxillary sinus tenderness and frontal sinus tenderness present.     Mouth/Throat:     Mouth: Mucous membranes are moist.  Pharynx: No posterior oropharyngeal erythema.  Eyes:     Pupils: Pupils are equal, round, and reactive to light.  Cardiovascular:     Rate and Rhythm: Normal rate and regular rhythm.     Heart sounds: Normal heart sounds.  Pulmonary:     Effort: Pulmonary effort is normal.     Breath sounds: Normal breath sounds. No wheezing or rhonchi.  Musculoskeletal:     Cervical back: Normal range of motion and neck supple.  Lymphadenopathy:     Cervical: No cervical adenopathy.  Skin:    General: Skin is warm and dry.  Neurological:     General: No focal deficit present.     Mental Status: He is alert and oriented to person, place, and time.  Psychiatric:        Mood and Affect: Mood normal.        Behavior: Behavior normal.      UC Treatments / Results  Labs (all labs ordered are listed, but only abnormal results are displayed) Labs Reviewed - No data to display  Comprehensive metabolic panel Order: [id number]  Status: Final result     Next appt: None   Test Result Released: No (inaccessible in MyChart)   0 Result Notes          Component Ref Range & Units (hover) 5 mo ago (10/30/23) 8 mo ago (08/05/23) 1 yr ago (04/16/22) 1 yr ago (04/16/22) 2 yr ago (03/27/22) 2 yr ago (03/27/22) 3 yr ago (01/18/21)  Sodium 138 136  140  141 138  Potassium 3.5 3.5  3.9  3.9 3.4 Low   Chloride 104 104  105  106 104  CO2 25 24  26  27 25   Glucose, Bld 105 High  75 CM  127 High  CM  114 High  CM 101 High  CM  Comment: Glucose reference range applies only to samples taken after fasting for at  least 8 hours.  BUN 12 9  15  8 7   Creatinine, Ser 1.00 0.97  1.36 High   0.94 0.84  Calcium 8.9 8.5 Low   9.1  9.0 9.0  Total Protein 6.9  7.7  7.0    Albumin 3.7  4.2  4.4    AST 34  33  29    ALT 58 High   51 High   46 High     Alkaline Phosphatase 61  54  47    Total Bilirubin 0.7  0.6 R  0.5 R    GFR, Estimated >60 >60 CM  >60 CM  >60 CM >60 CM  Comment: (NOTE) Calculated using the CKD-EPI Creatinine Equation (2021)  Anion gap 9 8 CM  9 CM  8 CM 9 CM  Comment: Performed at [facility], [address] [place], [place] [address]  Resulting Agency [facility]        Specimen Collected: 10/30/23 05:20 Last Resulted: 10/30/23 05:55    EKG   Radiology No results found.  Procedures Procedures (including critical care time)  Medications Ordered in UC Medications  ketorolac  (TORADOL ) 30 MG/ML injection 30 mg (30 mg Intramuscular Given 04/07/24 1530)    Initial Impression / Assessment and Plan / UC Course  I have reviewed the triage vital signs and the nursing notes.  Pertinent labs & imaging results that were available during my care of the patient were reviewed  by me and considered in my medical decision making (see chart for details).     Reviewed exam and symptoms with patient.  No red flags.  Will start Augmentin for sinusitis and left otitis media.  Patient was given Toradol  injection for his sinus headache.  He was monitored for 10 minutes after injection with no reaction noted and tolerated well.  He was instructed no NSAIDs for 24 hours and he verbalized understanding.  Albuterol inhaler as needed.  Patient requested Tussionex cough medicine.  Reviewed PDMP and he is above average risk.  Will do Bromfed instead.    He was encouraged rest, fluids, and PCP follow-up in 2 days for recheck.  ER precautions reviewed and patient verbalized understanding. Final Clinical Impressions(s) / UC Diagnoses    Final diagnoses:  Sinus headache  Acute suppurative otitis media of left ear without spontaneous rupture of tympanic membrane, recurrence not specified  Acute frontal sinusitis, recurrence not specified  Acute bronchitis, unspecified organism     Discharge Instructions      Start Augmentin twice daily for 10 days.  You may use the albuterol inhaler as needed for shortness of breath.  Take Bromfed as needed for your cough.  Lots of rest and fluids.  Please follow-up with your PCP in 2 days for recheck.  Please go to the ER for any worsening symptoms.  Hope you feel better soon!   ED Prescriptions     Medication Sig Dispense Auth. Provider   amoxicillin-clavulanate (AUGMENTIN) 875-125 MG tablet Take 1 tablet by mouth every 12 (twelve) hours for 10 days. 20 tablet Estrella Alcaraz, Jodi R, NP   albuterol (VENTOLIN HFA) 108 (90 Base) MCG/ACT inhaler Inhale 1-2 puffs into the lungs every 6 (six) hours as needed. 1 each Kioni Stahl, Jodi R, NP   brompheniramine-pseudoephedrine -DM 30-2-10 MG/5ML syrup Take 5 mLs by mouth 4 (four) times daily as needed. 120 mL Maylon Sailors, Jodi R, NP      PDMP not reviewed this encounter.   Alleen Arbour, NP 04/07/24 1554    Alleen Arbour, NP 04/07/24 1556

## 2024-04-07 NOTE — Discharge Instructions (Addendum)
 You were given a Toradol  injection in clinic today. Do not take any over the counter NSAID's such as Advil, ibuprofen, Aleve , or naproxen  for 24 hours. You may take tylenol  if needed Start Augmentin twice daily for 10 days.  You may use the albuterol inhaler as needed for shortness of breath.  Take Bromfed as needed for your cough.  Lots of rest and fluids.  Please follow-up with your PCP in 2 days for recheck.  Please go to the ER for any worsening symptoms.  Hope you feel better soon!

## 2024-04-07 NOTE — ED Triage Notes (Signed)
 Pt present with headaches, productive cough, sinus pressure x one week.   Also c/o chest and nasal congestion.   Home intervention: took abx that were given to his wife

## 2024-07-23 ENCOUNTER — Other Ambulatory Visit: Payer: Self-pay

## 2024-07-23 ENCOUNTER — Encounter (HOSPITAL_BASED_OUTPATIENT_CLINIC_OR_DEPARTMENT_OTHER): Payer: Self-pay | Admitting: Emergency Medicine

## 2024-07-23 ENCOUNTER — Emergency Department (HOSPITAL_BASED_OUTPATIENT_CLINIC_OR_DEPARTMENT_OTHER)
Admission: EM | Admit: 2024-07-23 | Discharge: 2024-07-23 | Disposition: A | Discharge status: Home / Self Care | Payer: Worker's Compensation | Attending: Emergency Medicine | Admitting: Emergency Medicine

## 2024-07-23 DIAGNOSIS — R519 Headache, unspecified: Secondary | ICD-10-CM | POA: Diagnosis present

## 2024-07-23 DIAGNOSIS — R42 Dizziness and giddiness: Secondary | ICD-10-CM | POA: Diagnosis not present

## 2024-07-23 DIAGNOSIS — Z8547 Personal history of malignant neoplasm of testis: Secondary | ICD-10-CM | POA: Insufficient documentation

## 2024-07-23 DIAGNOSIS — R110 Nausea: Secondary | ICD-10-CM | POA: Insufficient documentation

## 2024-07-23 DIAGNOSIS — F0781 Postconcussional syndrome: Secondary | ICD-10-CM | POA: Diagnosis not present

## 2024-07-23 MED ORDER — KETOROLAC TROMETHAMINE 15 MG/ML IJ SOLN
15.0000 mg | Freq: Once | INTRAMUSCULAR | Status: AC
  Administered 2024-07-23: 15 mg via INTRAVENOUS
  Filled 2024-07-23: qty 1

## 2024-07-23 MED ORDER — METOCLOPRAMIDE HCL 10 MG PO TABS: 10.0000 mg | ORAL_TABLET | Freq: Four times a day (QID) | ORAL | 0 refills | Status: AC

## 2024-07-23 MED ORDER — NAPROXEN 500 MG PO TABS: 500.0000 mg | ORAL_TABLET | Freq: Two times a day (BID) | ORAL | 0 refills | Status: AC

## 2024-07-23 MED ORDER — LACTATED RINGERS IV BOLUS
1000.0000 mL | Freq: Once | INTRAVENOUS | Status: AC
  Administered 2024-07-23: 1000 mL via INTRAVENOUS

## 2024-07-23 MED ORDER — DEXAMETHASONE SODIUM PHOSPHATE 10 MG/ML IJ SOLN
10.0000 mg | Freq: Once | INTRAMUSCULAR | Status: AC
  Administered 2024-07-23: 10 mg via INTRAVENOUS
  Filled 2024-07-23: qty 1

## 2024-07-23 MED ORDER — METOCLOPRAMIDE HCL 5 MG/ML IJ SOLN
10.0000 mg | Freq: Once | INTRAMUSCULAR | Status: AC
  Administered 2024-07-23: 10 mg via INTRAVENOUS
  Filled 2024-07-23: qty 2
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
## 2024-07-23 NOTE — Discharge Instructions (Addendum)
 You are seen today for symptoms likely reflective of postconcussive syndrome.  With headache manage at this time, recommend you continue to rest, hydrate, light exercise, taking Tylenol  and naproxen  as pain relief.  Take Tylenol  (acetominophen)  650mg  every 4-6 hours, as needed for pain or fever. Do not take more than 4,000 mg in a 24-hour period. As this may cause liver damage. While this is rare, if you begin to develop yellowing of the skin or eyes, stop taking and return to ER immediately.  Please take Naprosyn , 500mg  by mouth twice daily as needed for pain - this in an antiinflammatory medicine (NSAID) and is similar to ibuprofen - many people feel that it is stronger than ibuprofen and it is easier to take since it is a smaller pill.  Please use this only for 1 week - if your pain persists, you will need to follow up with your doctor in the office for ongoing guidance and pain control.    Please ensure to follow-up with PCP for evaluation in a couple weeks to reassess symptoms.  Return to the ED though if you have to have any new or worsening symptoms.

## 2024-07-28 ENCOUNTER — Other Ambulatory Visit: Payer: Self-pay

## 2024-07-28 ENCOUNTER — Emergency Department (HOSPITAL_COMMUNITY): Payer: Worker's Compensation

## 2024-07-28 ENCOUNTER — Emergency Department (HOSPITAL_COMMUNITY)
Admission: EM | Admit: 2024-07-28 | Discharge: 2024-07-29 | Disposition: A | Discharge status: Home / Self Care | Payer: Worker's Compensation

## 2024-07-28 ENCOUNTER — Encounter (HOSPITAL_COMMUNITY): Payer: Self-pay

## 2024-07-28 DIAGNOSIS — S060X9A Concussion with loss of consciousness of unspecified duration, initial encounter: Secondary | ICD-10-CM | POA: Diagnosis not present

## 2024-07-28 DIAGNOSIS — S0990XA Unspecified injury of head, initial encounter: Secondary | ICD-10-CM | POA: Diagnosis present

## 2024-07-28 DIAGNOSIS — R531 Weakness: Secondary | ICD-10-CM | POA: Insufficient documentation

## 2024-07-28 DIAGNOSIS — R519 Headache, unspecified: Secondary | ICD-10-CM

## 2024-07-28 LAB — COMPREHENSIVE METABOLIC PANEL WITH GFR
ALT: 39 U/L (ref 0–44)
AST: 22 U/L (ref 15–41)
Albumin: 3.3 g/dL — ABNORMAL LOW (ref 3.5–5.0)
Alkaline Phosphatase: 59 U/L (ref 38–126)
Anion gap: 11 (ref 5–15)
BUN: 10 mg/dL (ref 6–20)
CO2: 24 mmol/L (ref 22–32)
Calcium: 8.7 mg/dL — ABNORMAL LOW (ref 8.9–10.3)
Chloride: 102 mmol/L (ref 98–111)
Creatinine, Ser: 0.88 mg/dL (ref 0.61–1.24)
GFR, Estimated: >60 mL/min (ref >60)
Glucose, Bld: 79 mg/dL (ref 70–99)
Potassium: 4.1 mmol/L (ref 3.5–5.1)
Sodium: 137 mmol/L (ref 135–145)
Total Bilirubin: 0.8 mg/dL (ref 0.0–1.2)
Total Protein: 6.6 g/dL (ref 6.5–8.1)

## 2024-07-28 LAB — CBC WITH DIFFERENTIAL/PLATELET
Abs Immature Granulocytes: 0.11 K/uL — ABNORMAL HIGH (ref 0.00–0.07)
Basophils Absolute: 0.1 K/uL (ref 0.0–0.1)
Basophils Relative: 1 %
Eosinophils Absolute: 0.3 K/uL (ref 0.0–0.5)
Eosinophils Relative: 3 %
HCT: 48.3 % (ref 39.0–52.0)
Hemoglobin: 15.7 g/dL (ref 13.0–17.0)
Immature Granulocytes: 1 %
Lymphocytes Relative: 25 %
Lymphs Abs: 2.2 K/uL (ref 0.7–4.0)
MCH: 29.1 pg (ref 26.0–34.0)
MCHC: 32.5 g/dL (ref 30.0–36.0)
MCV: 89.6 fL (ref 80.0–100.0)
Monocytes Absolute: 1 K/uL (ref 0.1–1.0)
Monocytes Relative: 11 %
Neutro Abs: 5.3 K/uL (ref 1.7–7.7)
Neutrophils Relative %: 59 %
Platelets: 271 K/uL (ref 150–400)
RBC: 5.39 MIL/uL (ref 4.22–5.81)
RDW: 13.4 % (ref 11.5–15.5)
WBC: 9 K/uL (ref 4.0–10.5)
nRBC: 0 % (ref 0.0–0.2)
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
## 2024-07-29 MED ORDER — BUTALBITAL-APAP-CAFFEINE 50-325-40 MG PO TABS
1.0000 | ORAL_TABLET | Freq: Once | ORAL | Status: AC
  Administered 2024-07-29: 1 via ORAL
  Filled 2024-07-29: qty 1

## 2024-07-29 MED ORDER — MECLIZINE HCL 25 MG PO TABS: 25.0000 mg | ORAL_TABLET | Freq: Three times a day (TID) | ORAL | 0 refills | Status: AC | PRN

## 2024-07-29 MED ORDER — DEXAMETHASONE SODIUM PHOSPHATE 10 MG/ML IJ SOLN
10.0000 mg | Freq: Once | INTRAMUSCULAR | Status: AC
  Administered 2024-07-29: 10 mg via INTRAVENOUS
  Filled 2024-07-29: qty 1

## 2024-07-29 MED ORDER — DIPHENHYDRAMINE HCL 50 MG/ML IJ SOLN
25.0000 mg | Freq: Once | INTRAMUSCULAR | Status: AC
  Administered 2024-07-29: 25 mg via INTRAVENOUS
  Filled 2024-07-29: qty 1

## 2024-07-29 MED ORDER — KETOROLAC TROMETHAMINE 15 MG/ML IJ SOLN
15.0000 mg | Freq: Once | INTRAMUSCULAR | Status: AC
Start: 2024-07-29 — End: 2024-07-29
  Administered 2024-07-29: 15 mg via INTRAVENOUS
  Filled 2024-07-29: qty 1

## 2024-07-29 MED ORDER — BUTALBITAL-APAP-CAFFEINE 50-325-40 MG PO TABS
1.0000 | ORAL_TABLET | Freq: Four times a day (QID) | ORAL | 0 refills | Status: AC | PRN
Start: 2024-07-29 — End: 2025-07-29

## 2024-07-29 MED ORDER — MECLIZINE HCL 25 MG PO TABS: 25.0000 mg | ORAL_TABLET | Freq: Three times a day (TID) | ORAL | 0 refills | Status: DC | PRN

## 2024-07-29 MED ORDER — BUTALBITAL-APAP-CAFFEINE 50-325-40 MG PO TABS
1.0000 | ORAL_TABLET | Freq: Four times a day (QID) | ORAL | 0 refills | Status: DC | PRN
Start: 2024-07-29 — End: 2024-07-29

## 2024-07-29 MED ORDER — PROCHLORPERAZINE EDISYLATE 10 MG/2ML IJ SOLN
10.0000 mg | Freq: Once | INTRAMUSCULAR | Status: AC
Start: 2024-07-29 — End: 2024-07-29
  Administered 2024-07-29: 10 mg via INTRAVENOUS
  Filled 2024-07-29: qty 2

## 2024-07-29 MED ORDER — METHOCARBAMOL 500 MG PO TABS: 500.0000 mg | ORAL_TABLET | Freq: Two times a day (BID) | ORAL | 0 refills | Status: DC

## 2024-07-29 MED ORDER — METHOCARBAMOL 500 MG PO TABS: 500.0000 mg | ORAL_TABLET | Freq: Two times a day (BID) | ORAL | 0 refills | Status: AC

## 2024-07-29 MED ORDER — METHOCARBAMOL 500 MG PO TABS
750.0000 mg | ORAL_TABLET | Freq: Once | ORAL | Status: AC
  Administered 2024-07-29: 750 mg via ORAL
  Filled 2024-07-29: qty 2

## 2024-07-29 MED ORDER — MECLIZINE HCL 25 MG PO TABS
25.0000 mg | ORAL_TABLET | Freq: Once | ORAL | Status: AC
  Administered 2024-07-29: 25 mg via ORAL
  Filled 2024-07-29: qty 1

## 2024-07-29 MED ORDER — LACTATED RINGERS IV BOLUS
1000.0000 mL | Freq: Once | INTRAVENOUS | Status: AC
  Administered 2024-07-29: 1000 mL via INTRAVENOUS

## 2024-07-29 NOTE — Discharge Instructions (Addendum)
 Your workup today was reassuring.  I have placed a referral to neurology for further evaluation.  Please take the medications as prescribed as needed for your symptoms.  Take the Antivert  as needed for dizziness.  Take the Fioricet  as needed for headache.  Take the methocarbamol  as well if you are having any neck pain or stiffness.  If possible try not to take the Fioricet  daily as this can lead to rebound headaches if taken too regularly.  Please follow-up with the neurologist and return to the ER for worsening symptoms.
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
## 2024-07-30 ENCOUNTER — Ambulatory Visit: Payer: Worker's Compensation | Admitting: Internal Medicine

## 2024-08-01 ENCOUNTER — Ambulatory Visit
Admission: EM | Admit: 2024-08-01 | Discharge: 2024-08-01 | Disposition: A | Discharge status: Home / Self Care | Payer: Self-pay | Attending: Family Medicine | Admitting: Family Medicine

## 2024-08-01 DIAGNOSIS — I809 Phlebitis and thrombophlebitis of unspecified site: Secondary | ICD-10-CM

## 2024-08-01 NOTE — ED Triage Notes (Signed)
 Pt presents to UC for c/o a spot on his left arm that is raised, red and tender near where he had an IV inserted in the emergency department. He states it appeared this morning. He has not used any warm/cold compresses.

## 2024-08-01 NOTE — ED Provider Notes (Signed)
 [facility]    CSN: [id number] Arrival date & time: 08/01/24  1521      History   Chief Complaint Chief Complaint  Patient presents with   Rash    HPI Sean [name] is a 53 y.o. male.   Patient presenting today for evaluation of a raised red tender area to the left forearm where an IV was inserted in the emergency department 4 days ago.  Denies radiation of pain, numbness, tingling, chest pain, shortness of breath, fevers.  So far not trying anything over-the-counter for symptoms.      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   Carpal tunnel syndrome 08/16/2018   Ureteral calculus, left 07/04/2015   Renal colic on right side 03/25/2015   Nephrolithiasis 03/25/2015   COCCYGEAL PAIN 09/23/2007   Sprain of ankle 09/23/2007    Past Surgical History:  Procedure Laterality Date   ANTERIOR CERVICAL DECOMP/DISCECTOMY FUSION N/A 01/20/2021   Procedure: ANTERIOR CERVICAL DECOMPRESSION/DISCECTOMY FUSION 2 LEVELS CERVICAL FOUR-SIX;  Surgeon: Burnetta Aures, MD;  Location: [facility] OR;  Service: Orthopedics;  Laterality: N/A;  3 hrs   CYSTOSCOPY W/ URETERAL STENT PLACEMENT Right 03/25/2015   Procedure: CYSTOSCOPY, RETROGRADE PYELOGRAM  WITH RIGHT URETERAL STENT PLACEMENT;  Surgeon: Glendia Elizabeth, MD;  Location: [facility];  Service: Urology;  Laterality: Right;   CYSTOSCOPY WITH RETROGRADE PYELOGRAM, URETEROSCOPY AND STENT PLACEMENT Left 07/04/2015   Procedure: CYSTOSCOPY WITH RETROGRADE PYELOGRAM, URETEROSCOPY AND STENT PLACEMENT;  Surgeon: Garnette Shack, MD;  Location: [facility];  Service: Urology;  Laterality: Left;  With Holmium Laser   EXTRACORPOREAL SHOCK WAVE LITHOTRIPSY Left 04/20/2022   Procedure: EXTRACORPOREAL SHOCK WAVE LITHOTRIPSY (ESWL);   Surgeon: Elisabeth Valli JONETTA, MD;  Location: [facility];  Service: Urology;  Laterality: Left;   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 Authorizing Provider  albuterol  (VENTOLIN  HFA) 108 (90 Base) MCG/ACT inhaler Inhale 1-2 puffs into the lungs every 6 (six) hours as needed. 04/07/24   Mayer, Jodi R, NP  brompheniramine-pseudoephedrine -DM 30-2-10 MG/5ML syrup Take 5 mLs by mouth 4 (four) times daily as needed. 04/07/24   Mayer, Jodi R, NP  butalbital -acetaminophen -caffeine  (FIORICET ) 50-325-40 MG tablet Take 1 tablet by mouth every 6 (six) hours as needed for headache. 07/29/24 07/29/25  Ula Prentice SAUNDERS, MD  diazepam  (VALIUM ) 10 MG tablet Take 10 mg by mouth 3 (three) times daily. 02/14/22   [provider]  EPINEPHrine  0.3 mg/0.3 mL IJ SOAJ injection Inject 0.3 mg into the muscle as needed for anaphylaxis.    [provider]  escitalopram (LEXAPRO) 20 MG tablet Take 20 mg by mouth daily. 03/28/22   [provider]  famotidine  (PEPCID ) 20 MG tablet Take 20 mg by mouth daily as needed for heartburn or indigestion.    [provider]  furosemide  (LASIX ) 20 MG tablet TAKE 1 TABLET (20 MG TOTAL) BY MOUTH DAILY AS NEEDED FOR EDEMA. 02/27/23 11/24/23  Croitoru, Mihai, MD  meclizine  (ANTIVERT ) 25 MG tablet Take 1 tablet (25 mg total) by mouth 3 (three)  times daily as needed for dizziness. 07/29/24   Ula Prentice SAUNDERS, MD  methocarbamol  (ROBAXIN ) 500 MG tablet Take 1 tablet (500 mg total) by mouth 2 (two) times daily. 07/29/24   Ula Prentice SAUNDERS, MD  metoCLOPramide  (REGLAN ) 10 MG tablet Take 1 tablet (10 mg total) by mouth every 6 (six) hours. 07/23/24   Bauer, Collin S, PA-C  naproxen  (NAPROSYN ) 500 MG tablet Take 1 tablet (500 mg total) by mouth 2 (two) times daily. 07/23/24   Bauer, Collin S, PA-C  ondansetron  (ZOFRAN -ODT) 4 MG disintegrating tablet  4mg  ODT q4 hours prn nausea/vomit 10/30/23   Zammit, Joseph, MD  tamsulosin  (FLOMAX ) 0.4 MG CAPS capsule Take 1 capsule (0.4 mg total) by mouth daily. 10/30/23   Roselyn Carlin NOVAK, MD    Family History History reviewed. No pertinent family history.  Social History Social History   Tobacco Use   Smoking status: Former   Smokeless tobacco: Current    Types: Snuff  Vaping Use   Vaping status: Never Used  Substance Use Topics   Alcohol use: No   Drug use: No     Allergies   Bee venom, Shellfish allergy, Alpha-gal, Gabapentin, and Hydromorphone  hcl   Review of Systems Review of Systems Per HPI  Physical Exam Triage Vital Signs ED Triage Vitals [08/01/24 1529]  Encounter Vitals Group     BP 115/79     Girls Systolic BP Percentile      Girls Diastolic BP Percentile      Boys Systolic BP Percentile      Boys Diastolic BP Percentile      Pulse Rate 80     Resp 16     Temp 98.4 F (36.9 C)     Temp Source Oral     SpO2 92 %     Weight      Height      Head Circumference      Peak Flow      Pain Score 8     Pain Loc      Pain Education      Exclude from Growth Chart    No data found.  Updated Vital Signs BP 115/79 (BP Location: Right Arm)   Pulse 80   Temp 98.4 F (36.9 C) (Oral)   Resp 16   SpO2 92%   Visual Acuity Right Eye Distance:   Left Eye Distance:   Bilateral Distance:    Right Eye Near:   Left Eye Near:    Bilateral Near:     Physical Exam Vitals and nursing note reviewed.  Constitutional:      Appearance: Normal appearance.  HENT:     Head: Atraumatic.  Eyes:     Extraocular Movements: Extraocular movements intact.     Conjunctiva/sclera: Conjunctivae normal.  Cardiovascular:     Rate and Rhythm: Normal rate.  Pulmonary:     Effort: Pulmonary effort is normal.  Musculoskeletal:        General: Tenderness present. Normal range of motion.     Cervical back: Normal range of motion and neck supple.  Skin:    General: Skin is warm  and dry.     Comments: Trace erythema, firmness palpable to the left proximal forearm in the area of the IV insertion  Neurological:     Mental Status: He is oriented to person, place, and time.     Comments: Left upper extremity neurovascularly intact  Psychiatric:        Mood and Affect:  Mood normal.        Thought Content: Thought content normal.        Judgment: Judgment normal.      UC Treatments / Results  Labs (all labs ordered are listed, but only abnormal results are displayed) Labs Reviewed - No data to display  EKG   Radiology No results found.  Procedures Procedures (including critical care time)  Medications Ordered in UC Medications - No data to display  Initial Impression / Assessment and Plan / UC Course  I have reviewed the triage vital signs and the nursing notes.  Pertinent labs & imaging results that were available during my care of the patient were reviewed by me and considered in my medical decision making (see chart for details).     Consistent with superficial phlebitis.  Discussed RICE, supportive over-the-counter remedies and continued monitoring.  Follow-up for worsening symptoms.  Ace wrap applied.  Final Clinical Impressions(s) / UC Diagnoses   Final diagnoses:  Superficial phlebitis     Discharge Instructions      As we discussed, compression, elevation, ibuprofen and Tylenol  to help with symptoms and follow-up for worsening symptoms to include fevers, worsening redness and swelling, severe pain    ED Prescriptions   None    PDMP not reviewed this encounter.   Stuart Vernell Norris, [place] 08/01/24 1825

## 2024-08-01 NOTE — Discharge Instructions (Signed)
 As we discussed, compression, elevation, ibuprofen and Tylenol  to help with symptoms and follow-up for worsening symptoms to include fevers, worsening redness and swelling, severe pain

## 2024-11-19 ENCOUNTER — Other Ambulatory Visit: Payer: Self-pay | Admitting: Orthopedic Surgery

## 2024-11-19 DIAGNOSIS — M961 Postlaminectomy syndrome, not elsewhere classified: Secondary | ICD-10-CM

## 2024-11-20 ENCOUNTER — Ambulatory Visit
Admission: RE | Admit: 2024-11-20 | Discharge: 2024-11-20 | Disposition: A | Discharge status: Home / Self Care | Payer: Worker's Compensation | Source: Ambulatory Visit | Attending: Orthopedic Surgery | Admitting: Orthopedic Surgery

## 2024-11-20 DIAGNOSIS — M961 Postlaminectomy syndrome, not elsewhere classified: Secondary | ICD-10-CM

## 2024-12-12 ENCOUNTER — Other Ambulatory Visit: Payer: Self-pay | Admitting: Internal Medicine

## 2024-12-12 DIAGNOSIS — Z0000 Encounter for general adult medical examination without abnormal findings: Secondary | ICD-10-CM

## 2024-12-12 DIAGNOSIS — Z136 Encounter for screening for cardiovascular disorders: Secondary | ICD-10-CM

## 2024-12-16 ENCOUNTER — Ambulatory Visit
Admission: RE | Admit: 2024-12-16 | Discharge: 2024-12-16 | Payer: Self-pay | Attending: Internal Medicine | Admitting: Internal Medicine

## 2024-12-16 DIAGNOSIS — Z136 Encounter for screening for cardiovascular disorders: Secondary | ICD-10-CM

## 2024-12-16 DIAGNOSIS — Z0000 Encounter for general adult medical examination without abnormal findings: Secondary | ICD-10-CM

## 2025-04-07 ENCOUNTER — Ambulatory Visit: Payer: Self-pay | Admitting: Cardiovascular Disease
